# Patient Record
Sex: Male | Born: 1975
Health system: Southern US, Community
[De-identification: ages and names within clinical notes are randomized; demographics above are authoritative.]

## PROBLEM LIST (undated history)

## (undated) DIAGNOSIS — I10 Essential (primary) hypertension: Secondary | ICD-10-CM

## (undated) DIAGNOSIS — K259 Gastric ulcer, unspecified as acute or chronic, without hemorrhage or perforation: Secondary | ICD-10-CM

## (undated) DIAGNOSIS — E119 Type 2 diabetes mellitus without complications: Secondary | ICD-10-CM

## (undated) HISTORY — DX: Type 2 diabetes mellitus without complications: E11.9

## (undated) HISTORY — DX: Essential (primary) hypertension: I10

## (undated) HISTORY — PX: NO PAST SURGERIES: SHX2092

---

## 2017-11-25 ENCOUNTER — Emergency Department (HOSPITAL_COMMUNITY)
Admission: EM | Admit: 2017-11-25 | Discharge: 2017-11-25 | Disposition: A | Discharge status: Home / Self Care | Payer: Self-pay | Attending: Emergency Medicine | Admitting: Emergency Medicine

## 2017-11-25 ENCOUNTER — Encounter (HOSPITAL_COMMUNITY): Payer: Self-pay

## 2017-11-25 ENCOUNTER — Emergency Department (HOSPITAL_COMMUNITY): Payer: Self-pay

## 2017-11-25 DIAGNOSIS — R531 Weakness: Secondary | ICD-10-CM | POA: Insufficient documentation

## 2017-11-25 DIAGNOSIS — R29898 Other symptoms and signs involving the musculoskeletal system: Secondary | ICD-10-CM

## 2017-11-25 DIAGNOSIS — K279 Peptic ulcer, site unspecified, unspecified as acute or chronic, without hemorrhage or perforation: Secondary | ICD-10-CM | POA: Insufficient documentation

## 2017-11-25 DIAGNOSIS — F17210 Nicotine dependence, cigarettes, uncomplicated: Secondary | ICD-10-CM | POA: Insufficient documentation

## 2017-11-25 HISTORY — DX: Gastric ulcer, unspecified as acute or chronic, without hemorrhage or perforation: K25.9

## 2017-11-25 LAB — I-STAT TROPONIN, ED: TROPONIN I, POC: 0 ng/mL (ref 0.00–0.08)

## 2017-11-25 LAB — CBC WITH DIFFERENTIAL/PLATELET
BASOS ABS: 0 10*3/uL (ref 0.0–0.1)
BASOS PCT: 0 %
Eosinophils Absolute: 0 10*3/uL (ref 0.0–0.7)
Eosinophils Relative: 0 %
HEMATOCRIT: 47.9 % (ref 39.0–52.0)
Hemoglobin: 17.1 g/dL — ABNORMAL HIGH (ref 13.0–17.0)
LYMPHS ABS: 4.1 10*3/uL — AB (ref 0.7–4.0)
Lymphocytes Relative: 28 %
MCH: 31 pg (ref 26.0–34.0)
MCHC: 35.7 g/dL (ref 30.0–36.0)
MCV: 86.8 fL (ref 78.0–100.0)
MONO ABS: 0.9 10*3/uL (ref 0.1–1.0)
MONOS PCT: 6 %
NEUTROS PCT: 66 %
Neutro Abs: 9.5 10*3/uL — ABNORMAL HIGH (ref 1.7–7.7)
PLATELETS: 305 10*3/uL (ref 150–400)
RBC: 5.52 MIL/uL (ref 4.22–5.81)
RDW: 12.9 % (ref 11.5–15.5)
WBC: 14.7 10*3/uL — ABNORMAL HIGH (ref 4.0–10.5)

## 2017-11-25 LAB — CBG MONITORING, ED: Glucose-Capillary: 140 mg/dL — ABNORMAL HIGH (ref 65–99)

## 2017-11-25 LAB — COMPREHENSIVE METABOLIC PANEL
ALBUMIN: 4 g/dL (ref 3.5–5.0)
ALT: 23 U/L (ref 17–63)
AST: 19 U/L (ref 15–41)
Alkaline Phosphatase: 93 U/L (ref 38–126)
Anion gap: 12 (ref 5–15)
BILIRUBIN TOTAL: 0.7 mg/dL (ref 0.3–1.2)
BUN: 9 mg/dL (ref 6–20)
CALCIUM: 10 mg/dL (ref 8.9–10.3)
CO2: 23 mmol/L (ref 22–32)
Chloride: 100 mmol/L — ABNORMAL LOW (ref 101–111)
Creatinine, Ser: 0.83 mg/dL (ref 0.61–1.24)
GFR calc Af Amer: >60 mL/min (ref >60)
GLUCOSE: 146 mg/dL — AB (ref 65–99)
Potassium: 4 mmol/L (ref 3.5–5.1)
Sodium: 135 mmol/L (ref 135–145)
TOTAL PROTEIN: 7.5 g/dL (ref 6.5–8.1)

## 2017-11-25 LAB — APTT: aPTT: 34 seconds (ref 24–36)

## 2017-11-25 LAB — I-STAT CHEM 8, ED
BUN: 9 mg/dL (ref 6–20)
CALCIUM ION: 1.23 mmol/L (ref 1.15–1.40)
CREATININE: 0.7 mg/dL (ref 0.61–1.24)
Chloride: 101 mmol/L (ref 101–111)
Glucose, Bld: 146 mg/dL — ABNORMAL HIGH (ref 65–99)
HEMATOCRIT: 50 % (ref 39.0–52.0)
HEMOGLOBIN: 17 g/dL (ref 13.0–17.0)
Potassium: 4 mmol/L (ref 3.5–5.1)
Sodium: 138 mmol/L (ref 135–145)
TCO2: 25 mmol/L (ref 22–32)

## 2017-11-25 LAB — ETHANOL: Alcohol, Ethyl (B): <10 mg/dL (ref <10)

## 2017-11-25 LAB — LIPASE, BLOOD: Lipase: 31 U/L (ref 11–51)

## 2017-11-25 LAB — PROTIME-INR
INR: 1.04
Prothrombin Time: 13.5 seconds (ref 11.4–15.2)

## 2017-11-25 MED ORDER — HYDROMORPHONE HCL 1 MG/ML IJ SOLN: 1.0000 mg | Freq: Once | INTRAMUSCULAR | Status: DC

## 2017-11-25 MED ORDER — ALUM & MAG HYDROXIDE-SIMETH 200-200-20 MG/5ML PO SUSP
30.0000 mL | Freq: Once | ORAL | Status: AC
  Administered 2017-11-25: 30 mL via ORAL
  Filled 2017-11-25: qty 30

## 2017-11-25 MED ORDER — PANTOPRAZOLE SODIUM 40 MG IV SOLR
40.0000 mg | Freq: Once | INTRAVENOUS | Status: AC
Start: 2017-11-25 — End: 2017-11-25
  Administered 2017-11-25: 40 mg via INTRAVENOUS
  Filled 2017-11-25: qty 40

## 2017-11-25 MED ORDER — SODIUM CHLORIDE 0.9 % IV BOLUS (SEPSIS)
1000.0000 mL | Freq: Once | INTRAVENOUS | Status: AC
  Administered 2017-11-25: 1000 mL via INTRAVENOUS

## 2017-11-25 MED ORDER — ONDANSETRON HCL 4 MG/2ML IJ SOLN
4.0000 mg | Freq: Once | INTRAMUSCULAR | Status: AC
  Administered 2017-11-25: 4 mg via INTRAVENOUS
  Filled 2017-11-25: qty 2

## 2017-11-25 MED ORDER — HYDROMORPHONE HCL 1 MG/ML IJ SOLN
1.0000 mg | Freq: Once | INTRAMUSCULAR | Status: AC
  Administered 2017-11-25: 1 mg via INTRAVENOUS
  Filled 2017-11-25: qty 1

## 2017-11-25 MED ORDER — FAMOTIDINE IN NACL 20-0.9 MG/50ML-% IV SOLN
20.0000 mg | Freq: Once | INTRAVENOUS | Status: AC
  Administered 2017-11-25: 20 mg via INTRAVENOUS
  Filled 2017-11-25: qty 50

## 2017-11-25 MED ORDER — IOPAMIDOL (ISOVUE-300) INJECTION 61%
INTRAVENOUS | Status: AC
  Administered 2017-11-25: 100 mL
  Filled 2017-11-25: qty 100

## 2017-11-25 MED ORDER — OMEPRAZOLE 20 MG PO CPDR: 20.0000 mg | DELAYED_RELEASE_CAPSULE | Freq: Every day | ORAL | 0 refills | Status: DC

## 2017-11-25 NOTE — ED Notes (Signed)
Code stroke @ 1558

## 2017-11-25 NOTE — ED Provider Notes (Signed)
[facility] EMERGENCY DEPARTMENT Provider Note   CSN: [id number] Arrival date & time: 11/25/17  1551     History   Chief Complaint Chief Complaint  Patient presents with  . Code Stroke    HPI Frederick Cervantes is a 42 y.o. male.  The history is provided by the patient.  Neurologic Problem  This is a new problem. The current episode started 6 to 12 hours ago (730am). The problem has been resolved. Associated symptoms include abdominal pain. Pertinent negatives include no chest pain, no headaches and no shortness of breath. Nothing aggravates the symptoms.  Abdominal Pain   This is a new problem. The current episode started 6 to 12 hours ago. The problem occurs constantly. The problem has been gradually worsening. The pain is associated with eating. The pain is located in the epigastric region. The quality of the pain is sharp. The pain is severe. Associated symptoms include nausea, vomiting and constipation. Pertinent negatives include fever, dysuria, frequency and headaches.  -Patient states around 7 AM he was driving around and he had left arm weakness and cannot control the steering well but not resolved in less than an hour.  He states he does still have some tingling and numbness in his fingertips on the left hand.  He states he also started having severe epigastric abdominal pain and vomited about 10 times.  He states he has a history of stomach ulcers and this feels like that however is worse in intensity.  He states over the last 2 days he has taken about 40 tablets of Tums due to severe heartburn.  Past Medical History:  Diagnosis Date  . Ulcer, stomach peptic     There are no active problems to display for this patient.   History reviewed. No pertinent surgical history.      Home Medications    Prior to Admission medications   Medication Sig Start Date End Date Taking? Authorizing Provider  acetaminophen (TYLENOL) 500 MG tablet Take 500-1,000 mg  by mouth every 6 (six) hours as needed (for headaches).   Yes [provider]  aspirin EC 81 MG tablet Take 81 mg by mouth daily as needed ("for hypertension").    Yes [provider]  calcium carbonate (TUMS - DOSED IN MG ELEMENTAL CALCIUM) 500 MG chewable tablet Chew 1-2 tablets by mouth as needed for indigestion or heartburn.   Yes [provider]  omeprazole (PRILOSEC) 20 MG capsule Take 1 capsule (20 mg total) by mouth daily. 11/25/17   Dwana Melena, DO    Family History History reviewed. No pertinent family history.  Social History Social History   Tobacco Use  . Smoking status: Current Every Day Smoker    Packs/day: 1.00    Types: Cigarettes  . Smokeless tobacco: Never Used  Substance Use Topics  . Alcohol use: Never    Frequency: Never  . Drug use: Never     Allergies   Patient has no known allergies.   Review of Systems Review of Systems  Constitutional: Positive for appetite change and chills. Negative for fever.  HENT: Negative.   Eyes: Negative for visual disturbance.  Respiratory: Negative for cough and shortness of breath.   Cardiovascular: Negative for chest pain and leg swelling.  Gastrointestinal: Positive for abdominal pain, constipation, nausea and vomiting. Negative for abdominal distention and blood in stool.  Genitourinary: Negative for dysuria, flank pain and frequency.  Musculoskeletal: Negative for back pain.  Skin: Negative for rash.  Neurological: Positive for weakness and numbness. Negative for dizziness, facial asymmetry, speech difficulty and headaches.  All other systems reviewed and are negative.    Physical Exam Updated Vital Signs BP (!) 135/91   Pulse 76   Temp 98.7 F (37.1 C) (Oral)   Resp 19   Ht 6' (1.829 m)   Wt 112 kg (246 lb 14.6 oz)   SpO2 96%   BMI 33.49 kg/m   Physical Exam  Constitutional: He is oriented to person, place, and time. He appears well-developed and well-nourished. He appears  distressed.  HENT:  Head: Normocephalic and atraumatic.  Mouth/Throat: Oropharynx is clear and moist.  Eyes: Conjunctivae are normal.  Neck: Neck supple.  Cardiovascular: Normal rate, regular rhythm and intact distal pulses.  No murmur heard. Pulmonary/Chest: Effort normal and breath sounds normal. No respiratory distress. He has no wheezes. He has no rales.  Abdominal: Soft. He exhibits no distension. There is tenderness. There is guarding (localized guarding in epigastrium).  Musculoskeletal: He exhibits no edema.  Neurological: He is alert and oriented to person, place, and time. He has normal strength. A sensory deficit (mild L hand numbness) is present. No cranial nerve deficit. GCS eye subscore is 4. GCS verbal subscore is 5. GCS motor subscore is 6.  Skin: Skin is warm and dry.  Psychiatric: He has a normal mood and affect.  Nursing note and vitals reviewed.    ED Treatments / Results  Labs (all labs ordered are listed, but only abnormal results are displayed) Labs Reviewed  COMPREHENSIVE METABOLIC PANEL - Abnormal; Notable for the following components:      Result Value   Chloride 100 (*)    Glucose, Bld 146 (*)    All other components within normal limits  CBC WITH DIFFERENTIAL/PLATELET - Abnormal; Notable for the following components:   WBC 14.7 (*)    Hemoglobin 17.1 (*)    Neutro Abs 9.5 (*)    Lymphs Abs 4.1 (*)    All other components within normal limits  I-STAT CHEM 8, ED - Abnormal; Notable for the following components:   Glucose, Bld 146 (*)    All other components within normal limits  CBG MONITORING, ED - Abnormal; Notable for the following components:   Glucose-Capillary 140 (*)    All other components within normal limits  ETHANOL  PROTIME-INR  APTT  LIPASE, BLOOD  CBC  DIFFERENTIAL  RAPID URINE DRUG SCREEN, HOSP PERFORMED  URINALYSIS, ROUTINE W REFLEX MICROSCOPIC  I-STAT TROPONIN, ED    EKG EKG Interpretation  Date/Time:  Friday November 25 2017 16:20:24 EDT Ventricular Rate:  88 PR Interval:    QRS Duration: 82 QT Interval:  336 QTC Calculation: 407 R Axis:   72 Text Interpretation:  Sinus rhythm Probable left atrial enlargement Anteroseptal infarct, old Confirmed by Lorre Nick ([id number]) on 11/25/2017 4:23:31 PM   Radiology Dg Chest 2 View  Result Date: 11/25/2017 CLINICAL DATA:  Abdominal pain with shortness of breath today. Increased thirst. EXAM: CHEST - 2 VIEW COMPARISON:  None. FINDINGS: Lordotic positioning on the AP view. The heart size and mediastinal contours are normal. The lungs are clear. There is no pleural effusion or pneumothorax. No acute osseous findings are identified. Telemetry leads overlie the chest. IMPRESSION: No active cardiopulmonary process. Electronically Signed   By: Carey Bullocks M.D.   On: 11/25/2017 17:06   Ct Head Wo Contrast  Result Date: 11/25/2017 CLINICAL DATA:  Slurred speech. EXAM: CT HEAD WITHOUT CONTRAST TECHNIQUE: Contiguous axial  images were obtained from the base of the skull through the vertex without intravenous contrast. COMPARISON:  None. FINDINGS: Brain: There is focal hypodensity within the right inferior frontal pole. No hemorrhage, hydrocephalus, extra-axial collection or mass lesion/mass effect. Vascular: No hyperdense vessel or unexpected calcification. Skull: Normal. Negative for fracture or focal lesion. Sinuses/Orbits: No acute finding. Other: None. IMPRESSION: 1. Focal hypodensity within the right inferior frontal pole may reflect acute infarct, contusion in the setting of recent trauma, or artifact. No hemorrhage. Recommend MRI for further evaluation. Electronically Signed   By: Obie Dredge M.D.   On: 11/25/2017 16:27   Mr Brain Wo Contrast  Result Date: 11/25/2017 CLINICAL DATA:  Neuro deficits, subacute. Focal neuro deficit of greater than 6 hours. Stroke suspected. Left-sided weakness. Left arm numbness. Slurred speech beginning at 7 a.m. today which has since  resolved. EXAM: MRI HEAD WITHOUT CONTRAST TECHNIQUE: Multiplanar, multiecho pulse sequences of the brain and surrounding structures were obtained without intravenous contrast. COMPARISON:  CT head without contrast 11/25/2017 FINDINGS: Brain: Diffusion-weighted images demonstrate no acute or subacute infarction. There is no significant signal change to account for the abnormality seen on CT. This was likely artifactual. No significant white matter disease is present. The ventricles are of normal size. Brainstem and cerebellum are normal. The internal auditory canals are within normal limits. No significant extraaxial fluid collection is present. Vascular: Flow is present in the major intracranial arteries. Skull and upper cervical spine: The skull base is within normal limits. The craniocervical junction is normal. Marrow signal is normal. The upper cervical spine is unremarkable. Sinuses/Orbits: Mild mucosal thickening is present in the anterior ethmoid air cells bilaterally. The paranasal sinuses are otherwise clear. Globes and orbits are within normal limits. IMPRESSION: 1. Normal MRI of the brain.  The CT finding was artifactual. 2. Minimal ethmoid sinus disease. Electronically Signed   By: Marin Roberts M.D.   On: 11/25/2017 18:58   Ct Abdomen Pelvis W Contrast  Result Date: 11/25/2017 CLINICAL DATA:  Acute epigastric pain with nausea and vomiting. History of gastric ulcers. EXAM: CT ABDOMEN AND PELVIS WITH CONTRAST TECHNIQUE: Multidetector CT imaging of the abdomen and pelvis was performed using the standard protocol following bolus administration of intravenous contrast. CONTRAST:  ISOVUE-300 IOPAMIDOL (ISOVUE-300) INJECTION 61% COMPARISON:  None. FINDINGS: The patient vomited at the beginning of the scan. The study was resumed after a slight pause. This resulted in delayed phase imaging. No other reported complications. Lower chest: Clear lung bases. No significant pleural or pericardial  effusion. Hepatobiliary: Background mild hepatic steatosis without focal abnormality. No evidence of gallstones, gallbladder wall thickening or biliary dilatation. Pancreas: Unremarkable. No pancreatic ductal dilatation or surrounding inflammatory changes. Spleen: Normal in size without focal abnormality. Adrenals/Urinary Tract: Both adrenal glands appear normal. The kidneys appear normal without evidence of urinary tract calculus, suspicious lesion or hydronephrosis. No bladder abnormalities are seen. Stomach/Bowel: No enteric contrast administered. No gross abnormality of the stomach. No evidence of bowel wall thickening, distention or surrounding inflammatory change. The appendix appears normal. Vascular/Lymphatic: There are no enlarged abdominal or pelvic lymph nodes. Limited contrast bolus as above. Aortic and branch vessel atherosclerosis without evidence of acute vascular findings. Reproductive: The prostate gland and seminal vesicles appear unremarkable. Other: Tiny umbilical hernia containing only fat.  No ascites. Musculoskeletal: No acute or significant osseous findings. IMPRESSION: 1. No acute findings or explanation for the patient's symptoms. Gastric evaluation limited by the lack of enteric contrast. 2. Mild hepatic steatosis. 3. Mild Aortic  Atherosclerosis (ICD10-I70.0). Electronically Signed   By: Carey Bullocks M.D.   On: 11/25/2017 19:53    Procedures Procedures (including critical care time)  Medications Ordered in ED Medications  ondansetron (ZOFRAN) injection 4 mg (4 mg Intravenous Given 11/25/17 1630)  HYDROmorphone (DILAUDID) injection 1 mg (1 mg Intravenous Given 11/25/17 1631)  sodium chloride 0.9 % bolus 1,000 mL (0 mLs Intravenous Stopped 11/25/17 1735)  iopamidol (ISOVUE-300) 61 % injection (100 mLs  Contrast Given 11/25/17 1913)  alum & mag hydroxide-simeth (MAALOX/MYLANTA) 200-200-20 MG/5ML suspension 30 mL (30 mLs Oral Given 11/25/17 2018)  ondansetron (ZOFRAN) injection 4 mg  (4 mg Intravenous Given 11/25/17 2018)  sodium chloride 0.9 % bolus 1,000 mL (0 mLs Intravenous Stopped 11/25/17 2105)  HYDROmorphone (DILAUDID) injection 1 mg (1 mg Intravenous Given 11/25/17 2042)  famotidine (PEPCID) IVPB 20 mg premix (0 mg Intravenous Stopped 11/25/17 2117)  pantoprazole (PROTONIX) injection 40 mg (40 mg Intravenous Given 11/25/17 2047)     Initial Impression / Assessment and Plan / ED Course  I have reviewed the triage vital signs and the nursing notes.  Pertinent labs & imaging results that were available during my care of the patient were reviewed by me and considered in my medical decision making (see chart for details).    Patient is a 42 year old male, left arm weakness episode.With history of tobacco abuse, stomach ulcer who presents for evaluation of severe epigastric abdominal pain, vomiting  A code stroke was called from triage due to reported left arm weakness.  He is outside of any TPA window and on initial screening exam he had no notable deficits on exam so the code stroke is discontinued.  The initial CT had had some question of findings in the right frontal lobe but this was favored to be artifact by the neuroradiologist with discussion with the neurologist.  Stat MRI is ordered per neurology recommendations to evaluate for acute stroke.  This was also negative for any acute findings.  Abdominal labs obtained as above.  Patient did have a leukocytosis of 14.7, normal hemoglobin.  CMP did not show any acute findings.  Lipase was normal.  UA was normal.  He had a negative troponin and EKG.  CT abdomen pelvis obtained to rule out perforated GI ulcer.  This is negative for any acute findings.  Patient received a total of 2 doses of Dilaudid and Zofran, as well as IV Pepcid and Protonix.  Maalox given as well.  On reevaluation patient's pain was controlled he states his symptoms have resolved.  During the ED course spoke with Dr. Wilford Corner and Dr. Luan Pulling regarding  this patient.  After his MRI was done, spoke with Dr. Luan Pulling who is on tonight and given he is otherwise low risk and has a negative MRI, no further inpatient TIA workup needed.  Patient can follow-up with outpatient neurology so he will be referred to low-power neurology.  Patient's epigastric abdominal pain likely secondary to peptic ulcer disease.  Patient counseled on smoking cessation and dietary changes.  Omeprazole as prescribed.  We will hold off on low-dose aspirin for secondary stroke prevention given we are also treating him for likely peptic ulcer disease.  Patient will follow up with [facility] gastroenterology regarding this.    Patient also referred to the PCP for follow-up of likely hypertension and any other comorbidities that may be undiagnosed.  Patient discharged in good condition with strict return precautions.   Final Clinical Impressions(s) / ED Diagnoses   Final diagnoses:  PUD (  peptic ulcer disease)  Left arm weakness    ED Discharge Orders        Ordered    omeprazole (PRILOSEC) 20 MG capsule  Daily     11/25/17 2150       Dwana MelenaDong, Riyad Keena, DO 11/25/17 2328

## 2017-11-25 NOTE — ED Notes (Signed)
Pt arrived in [place] this RN met pt in CT scanner

## 2017-11-25 NOTE — ED Notes (Signed)
Patient transported to MRI 

## 2017-11-25 NOTE — ED Triage Notes (Signed)
Pt arrived via POV from home c/o left sided weakness, left arm numbness, slurred speech since 7am, n/v abdominal pain, diaphoresis.

## 2017-11-25 NOTE — ED Notes (Signed)
Sent label to main lab to run lipase blood test.

## 2017-11-25 NOTE — ED Provider Notes (Signed)
I saw and evaluated the patient, reviewed the resident's note and I agree with the findings and plan.   EKG Interpretation  Date/Time:  Friday November 25 2017 16:20:24 EDT Ventricular Rate:  88 PR Interval:    QRS Duration: 82 QT Interval:  336 QTC Calculation: 407 R Axis:   72 Text Interpretation:  Sinus rhythm Probable left atrial enlargement Anteroseptal infarct, old Confirmed by Lorre NickAllen, Keelon Zurn ([id number]) on 11/25/2017 4:23:31 PM        42 year old male presents with 1 day history of left hand paresthesias.  Symptoms sudden onset.  No associated ataxia.  No severe headaches.  On exam here he is neurological exam is positive for subjective left hand paresthesias.  His speech is normal.  Workup is pending at this time   Lorre NickAllen, Zaliyah Meikle, MD 11/25/17 1624

## 2019-04-07 IMAGING — CT CT HEAD W/O CM
4 series · 16 of 47 positions shown, 18 images · non-contrast
Comparison: None.

CLINICAL DATA: Slurred speech.

EXAM:
CT HEAD WITHOUT CONTRAST
TECHNIQUE: Contiguous axial images were obtained from the base of the skull
through the vertex without intravenous contrast.

[Series 3: head wo · axial · 0.46mm/px · z∈[-144,-24]mm · 7 of 33 slices shown, 9 images]
[im 5/33  brain]
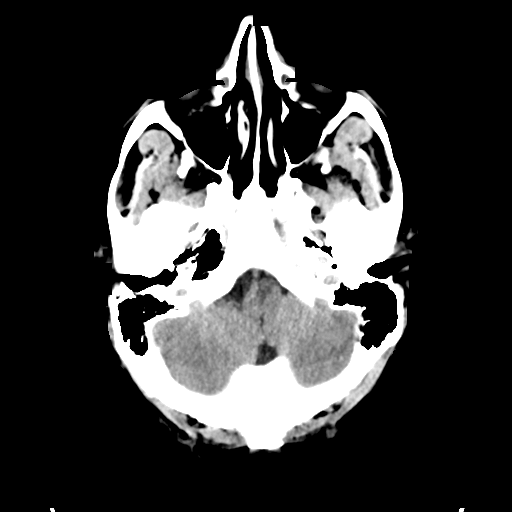
[im 5/33  bone]
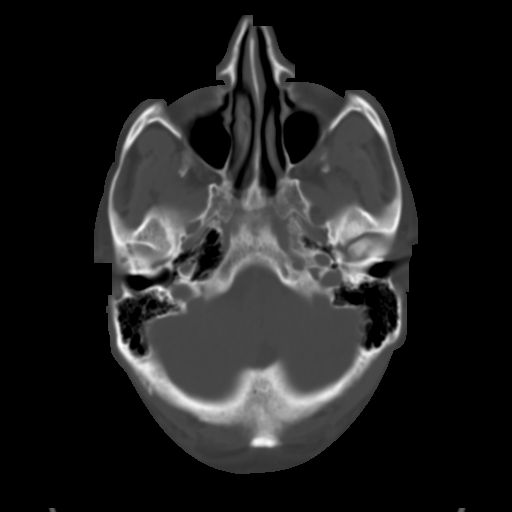
[im 9/33  brain]
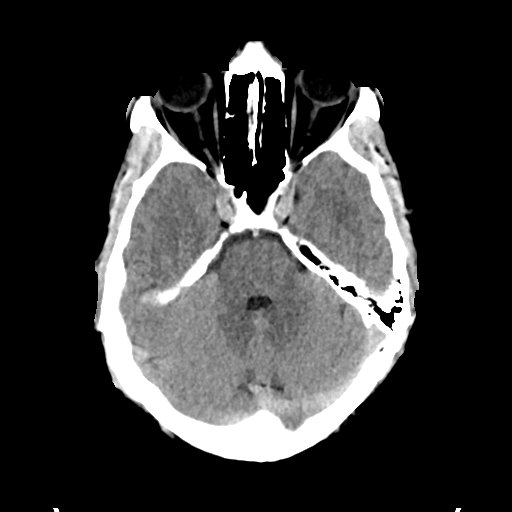
[im 13/33  brain]
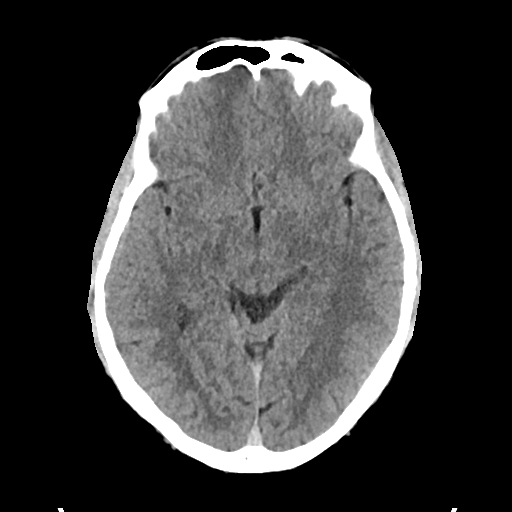
[im 17/33  brain]
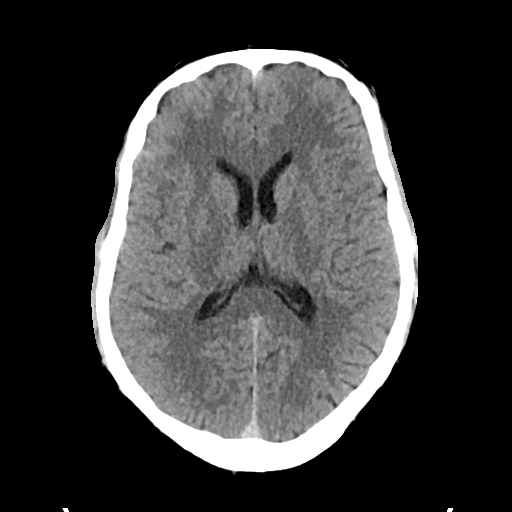
[im 21/33  brain]
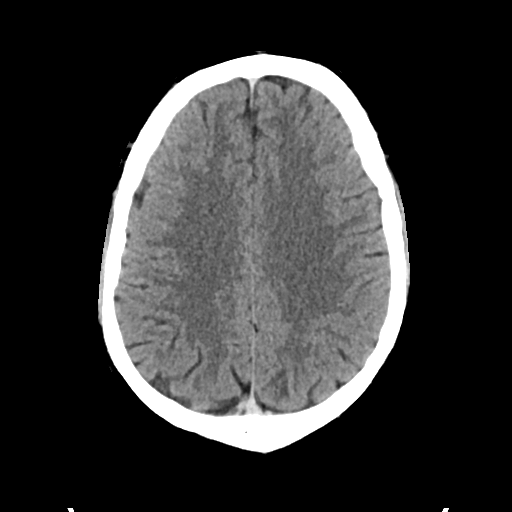
[im 21/33  bone]
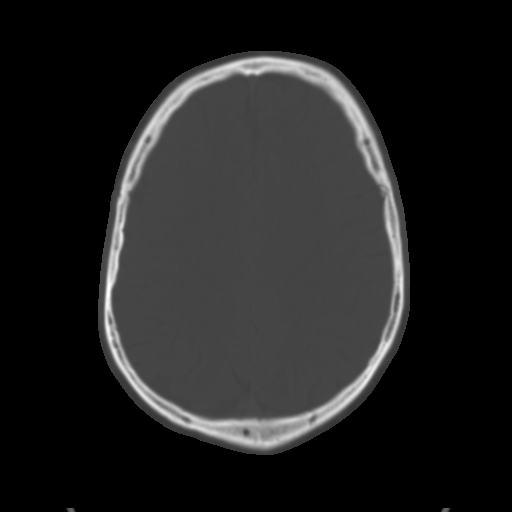
[im 25/33  brain]
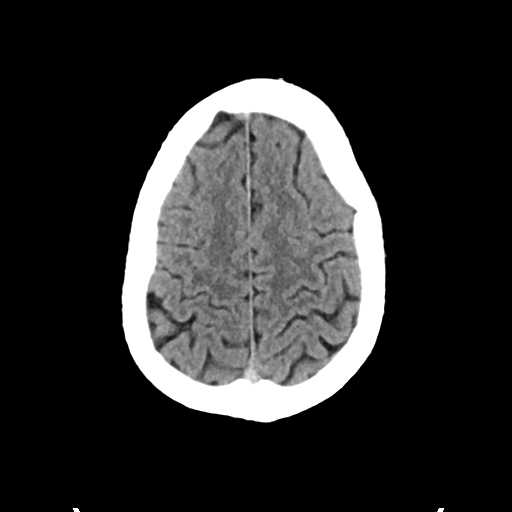
[im 29/33  brain]
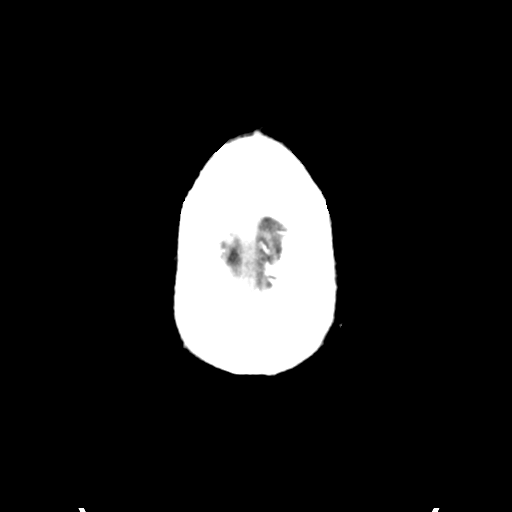

[Series 4: head bone · axial · 0.46mm/px · z∈[-148,-116]mm · 3 of 81 slices shown]
[im 9/81  bone]
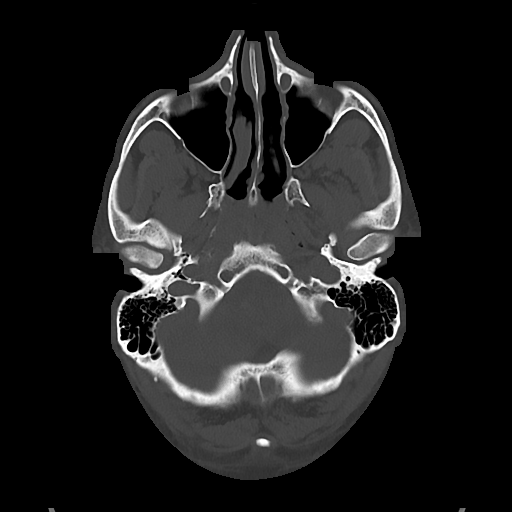
[im 17/81  bone]
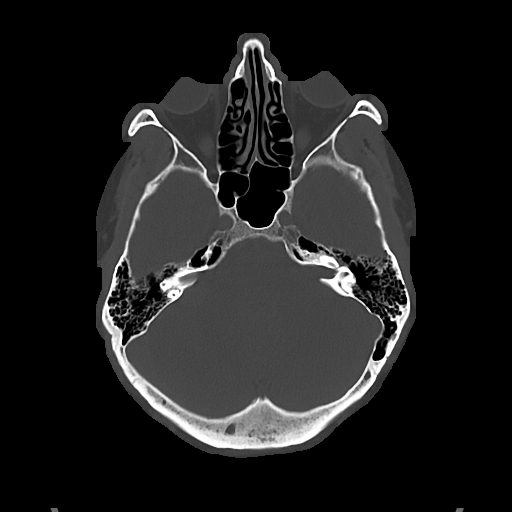
[im 25/81  bone]
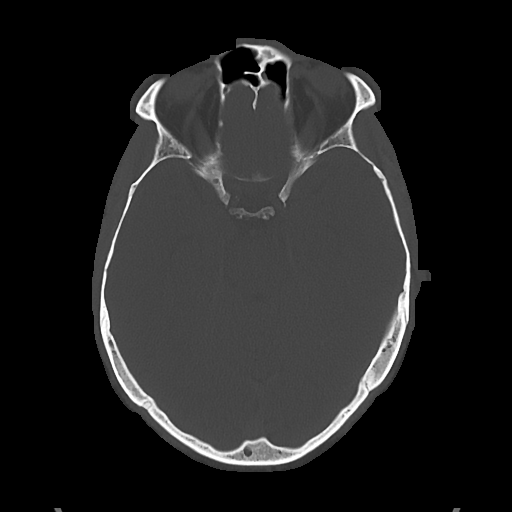

[Series 5: cor soft · coronal · 0.34mm/px · 3 of 78 slices shown]
[im 26/78  brain]
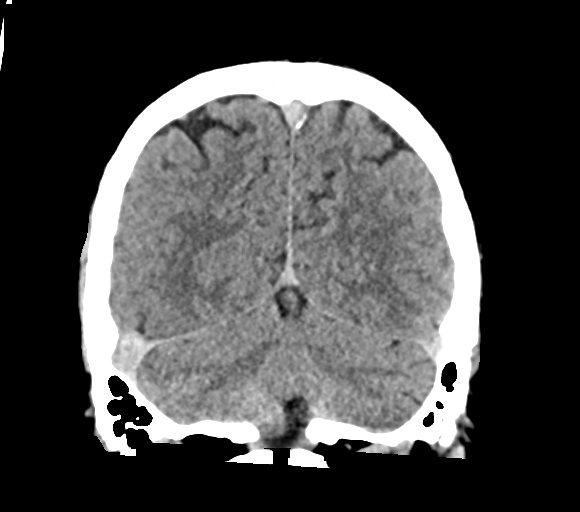
[im 35/78  brain]
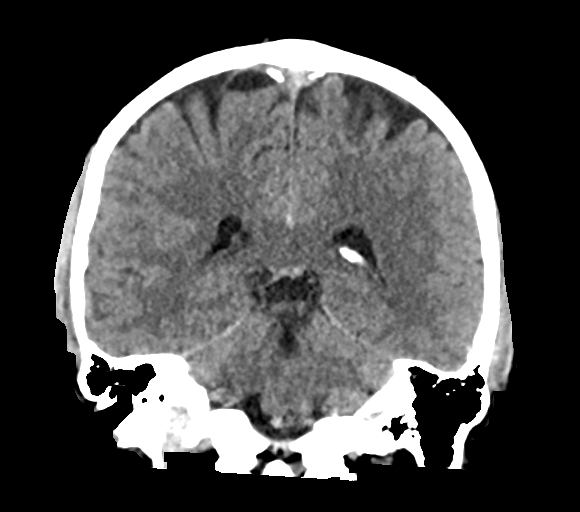
[im 43/78  brain]
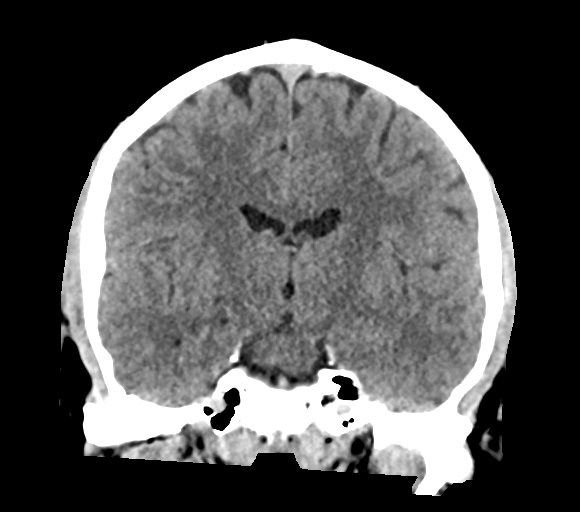

[Series 6: sag soft · sagittal · 0.32mm/px · 3 of 67 slices shown]
[im 23/67  brain]
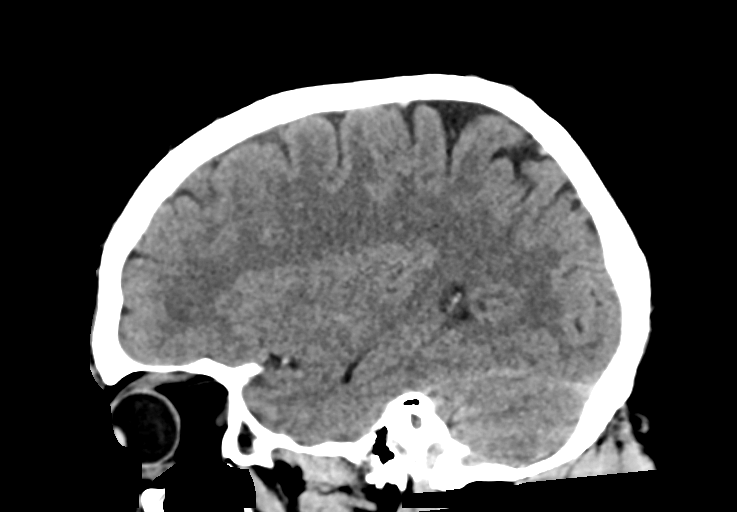
[im 34/67  brain]
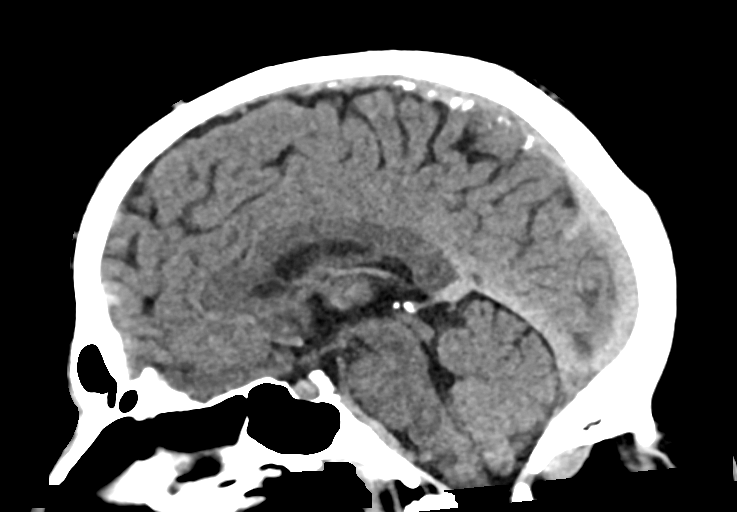
[im 45/67  brain]
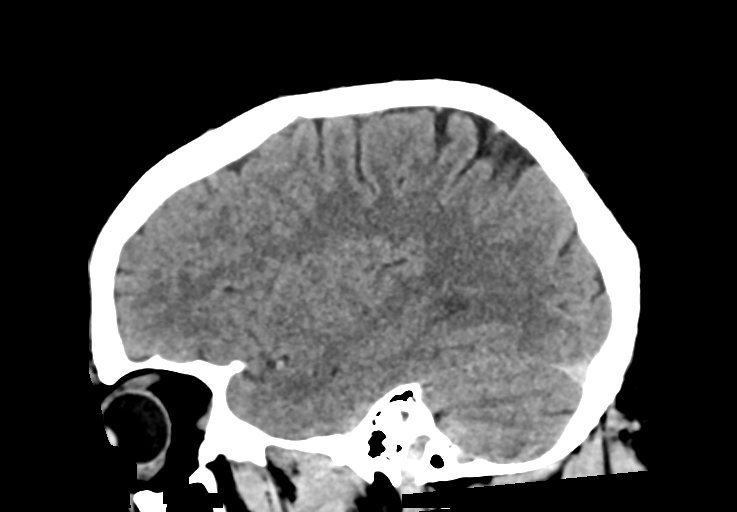

[16 of 47 positions shown; findings below may reference images not displayed]

FINDINGS: Brain: There is focal hypodensity within the right inferior frontal
pole. No hemorrhage, hydrocephalus, extra-axial collection or mass
lesion/mass effect.

Vascular: No hyperdense vessel or unexpected calcification.

Skull: Normal. Negative for fracture or focal lesion.

Sinuses/Orbits: No acute finding.

Other: None.
IMPRESSION: 1. Focal hypodensity within the right inferior frontal pole may
reflect acute infarct, contusion in the setting of recent trauma, or
artifact. No hemorrhage. Recommend MRI for further evaluation.

## 2020-03-12 ENCOUNTER — Emergency Department (HOSPITAL_COMMUNITY)
Admission: EM | Admit: 2020-03-12 | Discharge: 2020-03-12 | Disposition: A | Discharge status: Home / Self Care | Payer: Self-pay | Attending: Emergency Medicine | Admitting: Emergency Medicine

## 2020-03-12 ENCOUNTER — Emergency Department (HOSPITAL_COMMUNITY): Payer: Self-pay

## 2020-03-12 ENCOUNTER — Other Ambulatory Visit: Payer: Self-pay

## 2020-03-12 ENCOUNTER — Encounter (HOSPITAL_COMMUNITY): Payer: Self-pay | Admitting: Emergency Medicine

## 2020-03-12 DIAGNOSIS — R5383 Other fatigue: Secondary | ICD-10-CM | POA: Insufficient documentation

## 2020-03-12 DIAGNOSIS — Z7982 Long term (current) use of aspirin: Secondary | ICD-10-CM | POA: Insufficient documentation

## 2020-03-12 DIAGNOSIS — R05 Cough: Secondary | ICD-10-CM | POA: Insufficient documentation

## 2020-03-12 DIAGNOSIS — F17210 Nicotine dependence, cigarettes, uncomplicated: Secondary | ICD-10-CM | POA: Insufficient documentation

## 2020-03-12 DIAGNOSIS — R0789 Other chest pain: Secondary | ICD-10-CM | POA: Insufficient documentation

## 2020-03-12 DIAGNOSIS — R739 Hyperglycemia, unspecified: Secondary | ICD-10-CM | POA: Insufficient documentation

## 2020-03-12 DIAGNOSIS — R9431 Abnormal electrocardiogram [ECG] [EKG]: Secondary | ICD-10-CM | POA: Insufficient documentation

## 2020-03-12 DIAGNOSIS — R42 Dizziness and giddiness: Secondary | ICD-10-CM | POA: Insufficient documentation

## 2020-03-12 DIAGNOSIS — Z79899 Other long term (current) drug therapy: Secondary | ICD-10-CM | POA: Insufficient documentation

## 2020-03-12 DIAGNOSIS — R0602 Shortness of breath: Secondary | ICD-10-CM | POA: Insufficient documentation

## 2020-03-12 DIAGNOSIS — R631 Polydipsia: Secondary | ICD-10-CM | POA: Insufficient documentation

## 2020-03-12 DIAGNOSIS — R519 Headache, unspecified: Secondary | ICD-10-CM | POA: Insufficient documentation

## 2020-03-12 DIAGNOSIS — R531 Weakness: Secondary | ICD-10-CM | POA: Insufficient documentation

## 2020-03-12 DIAGNOSIS — R358 Other polyuria: Secondary | ICD-10-CM | POA: Insufficient documentation

## 2020-03-12 DIAGNOSIS — R079 Chest pain, unspecified: Secondary | ICD-10-CM

## 2020-03-12 LAB — TROPONIN I (HIGH SENSITIVITY): Troponin I (High Sensitivity): 2 ng/L (ref <18)

## 2020-03-12 LAB — URINALYSIS, ROUTINE W REFLEX MICROSCOPIC
Bacteria, UA: NONE SEEN
Bilirubin Urine: NEGATIVE
Glucose, UA: >=500 mg/dL — AB
Hgb urine dipstick: NEGATIVE
Ketones, ur: 5 mg/dL — AB
Leukocytes,Ua: NEGATIVE
Nitrite: NEGATIVE
Protein, ur: NEGATIVE mg/dL
Specific Gravity, Urine: 1.033 — ABNORMAL HIGH (ref 1.005–1.030)
pH: 6 (ref 5.0–8.0)

## 2020-03-12 LAB — CBC WITH DIFFERENTIAL/PLATELET
Abs Immature Granulocytes: 0.04 10*3/uL (ref 0.00–0.07)
Basophils Absolute: 0 10*3/uL (ref 0.0–0.1)
Basophils Relative: 0 %
Eosinophils Absolute: 0.2 10*3/uL (ref 0.0–0.5)
Eosinophils Relative: 2 %
HCT: 47.7 % (ref 39.0–52.0)
Hemoglobin: 16.6 g/dL (ref 13.0–17.0)
Immature Granulocytes: 0 %
Lymphocytes Relative: 35 %
Lymphs Abs: 3.4 10*3/uL (ref 0.7–4.0)
MCH: 29.9 pg (ref 26.0–34.0)
MCHC: 34.8 g/dL (ref 30.0–36.0)
MCV: 85.8 fL (ref 80.0–100.0)
Monocytes Absolute: 0.7 10*3/uL (ref 0.1–1.0)
Monocytes Relative: 7 %
Neutro Abs: 5.5 10*3/uL (ref 1.7–7.7)
Neutrophils Relative %: 56 %
Platelets: 254 10*3/uL (ref 150–400)
RBC: 5.56 MIL/uL (ref 4.22–5.81)
RDW: 12.1 % (ref 11.5–15.5)
WBC: 9.8 10*3/uL (ref 4.0–10.5)
nRBC: 0 % (ref 0.0–0.2)

## 2020-03-12 LAB — CBG MONITORING, ED
Glucose-Capillary: 398 mg/dL — ABNORMAL HIGH (ref 70–99)
Glucose-Capillary: 279 mg/dL — ABNORMAL HIGH (ref 70–99)
Glucose-Capillary: 259 mg/dL — ABNORMAL HIGH (ref 70–99)

## 2020-03-12 LAB — BASIC METABOLIC PANEL
Anion gap: 11 (ref 5–15)
BUN: 12 mg/dL (ref 6–20)
CO2: 24 mmol/L (ref 22–32)
Calcium: 9.2 mg/dL (ref 8.9–10.3)
Chloride: 99 mmol/L (ref 98–111)
Creatinine, Ser: 0.61 mg/dL (ref 0.61–1.24)
GFR calc Af Amer: >60 mL/min (ref >60)
GFR calc non Af Amer: >60 mL/min (ref >60)
Glucose, Bld: 366 mg/dL — ABNORMAL HIGH (ref 70–99)
Potassium: 4.1 mmol/L (ref 3.5–5.1)
Sodium: 134 mmol/L — ABNORMAL LOW (ref 135–145)

## 2020-03-12 LAB — D-DIMER, QUANTITATIVE: D-Dimer, Quant: <0.27 ug/mL-FEU (ref 0.00–0.50)

## 2020-03-12 MED ORDER — METFORMIN HCL 500 MG PO TABS: 500.0000 mg | ORAL_TABLET | Freq: Two times a day (BID) | ORAL | 0 refills | Status: DC

## 2020-03-12 MED ORDER — INSULIN ASPART 100 UNIT/ML ~~LOC~~ SOLN
5.0000 [IU] | Freq: Once | SUBCUTANEOUS | Status: AC
  Administered 2020-03-12: 19:00:00 5 [IU] via SUBCUTANEOUS
  Filled 2020-03-12: qty 0.05

## 2020-03-12 MED ORDER — SODIUM CHLORIDE 0.9 % IV BOLUS
1000.0000 mL | Freq: Once | INTRAVENOUS | Status: AC
  Administered 2020-03-12: 16:00:00 1000 mL via INTRAVENOUS

## 2020-03-12 NOTE — ED Provider Notes (Signed)
[facility]-EMERGENCY DEPT Provider Note   CSN: [id number] Arrival date & time: 03/12/20  1512     History Chief Complaint  Patient presents with  . Hyperglycemia    Frederick Cervantes is a 44 y.o. male.  Frederick Cervantes is a 44 y.o. male with a history of peptic ulcer disease and tobacco abuse, who presents to the emergency department complaining of 3 months of generalized weakness, fatigue, polyuria and polydipsia.  He reports that today his wife finally convinced him to check his blood sugar worried that that may be contributing and it was 580 both times when he checked it at home.  CBG of 450 with EMS.  Patient denies any prior diagnosis of diabetes or family history.  He states at first he thought symptoms were due to him driving a lot, not getting much rest or drinking enough water, he states that he has been incredibly thirsty despite trying to drink more water and has had frequent urination but denies dysuria.  Denies any fevers or chills.  No associated abdominal pain, nausea or vomiting.  But he does state that he has had pain over the left side of his chest that is worse with cough or deep breathing.  He states he has had a frequent dry nonproductive cough.  Does report he smokes 2 packs of cigarettes per day.  Denies history of heart or lung problems, no previous history of blood clots.  Drives several hours each day.  He has not taken any medications to treat his symptoms prior to arrival.  No other aggravating or alleviating factors.  Reports that he does not see a doctor regularly.        Past Medical History:  Diagnosis Date  . Ulcer, stomach peptic     There are no problems to display for this patient.   History reviewed. No pertinent surgical history.     No family history on file.  Social History   Tobacco Use  . Smoking status: Current Every Day Smoker    Packs/day: 1.00    Types: Cigarettes  . Smokeless tobacco: Never Used    Substance Use Topics  . Alcohol use: Never  . Drug use: Never    Home Medications Prior to Admission medications   Medication Sig Start Date End Date Taking? Authorizing Provider  aspirin EC 81 MG tablet Take 81 mg by mouth daily as needed ("for hypertension").    Yes [provider]  calcium carbonate (TUMS - DOSED IN MG ELEMENTAL CALCIUM) 500 MG chewable tablet Chew 1-2 tablets by mouth as needed for indigestion or heartburn.   Yes [provider]  omeprazole (PRILOSEC) 20 MG capsule Take 1 capsule (20 mg total) by mouth daily. 11/25/17  Yes Dong, Robin, DO  acetaminophen (TYLENOL) 500 MG tablet Take 500-1,000 mg by mouth every 6 (six) hours as needed (for headaches). Patient not taking: Reported on 03/12/2020    [provider]  metFORMIN (GLUCOPHAGE) 500 MG tablet Take 1 tablet (500 mg total) by mouth 2 (two) times daily with a meal. 03/12/20   Dartha LodgeFord, Gavyn Ybarra N, PA-C    Allergies    Patient has no known allergies.  Review of Systems   Review of Systems  Constitutional: Positive for fatigue and unexpected weight change. Negative for chills and fever.  HENT: Negative.   Eyes: Positive for visual disturbance.  Respiratory: Positive for cough and shortness of breath.   Cardiovascular: Positive for chest pain. Negative for  palpitations and leg swelling.  Gastrointestinal: Positive for constipation. Negative for abdominal pain, blood in stool, diarrhea, nausea and vomiting.  Endocrine: Positive for polydipsia and polyuria.  Genitourinary: Negative for dysuria and frequency.  Musculoskeletal: Positive for myalgias. Negative for arthralgias.  Skin: Negative for color change and rash.  Neurological: Positive for dizziness, weakness (Generalized) and headaches. Negative for tremors, seizures, syncope, facial asymmetry, speech difficulty, light-headedness and numbness.    Physical Exam Updated Vital Signs BP 127/82 (BP Location: Right Arm)   Pulse 80   Temp 97.7  F (36.5 C) (Oral)   Resp 18   SpO2 95%   Physical Exam Vitals and nursing note reviewed.  Constitutional:      General: He is not in acute distress.    Appearance: Normal appearance. He is well-developed and normal weight. He is not ill-appearing or diaphoretic.     Comments: Patient is overall well-appearing and in no distress  HENT:     Head: Normocephalic and atraumatic.  Eyes:     General:        Right eye: No discharge.        Left eye: No discharge.     Pupils: Pupils are equal, round, and reactive to light.  Cardiovascular:     Rate and Rhythm: Normal rate and regular rhythm.     Pulses: Normal pulses.     Heart sounds: Normal heart sounds. No murmur heard.  No friction rub. No gallop.   Pulmonary:     Effort: Pulmonary effort is normal. No respiratory distress.     Breath sounds: Normal breath sounds. No wheezing or rales.     Comments: Respirations equal and unlabored, patient able to speak in full sentences, lungs clear to auscultation bilaterally, there is tenderness over the left chest wall at the costochondral margin with an area of palpable bony prominence, no overlying skin changes Chest:     Chest wall: Tenderness present.  Abdominal:     General: Bowel sounds are normal. There is no distension.     Palpations: Abdomen is soft. There is no mass.     Tenderness: There is no abdominal tenderness. There is no guarding.  Musculoskeletal:        General: No deformity.     Cervical back: Neck supple.  Skin:    General: Skin is warm and dry.     Capillary Refill: Capillary refill takes less than 2 seconds.  Neurological:     Mental Status: He is alert.     Coordination: Coordination normal.     Comments: Speech is clear, able to follow commands CN III-XII intact Normal strength in upper and lower extremities bilaterally including dorsiflexion and plantar flexion, strong and equal grip strength Sensation normal to light and sharp touch Moves extremities without  ataxia, coordination intact  Psychiatric:        Mood and Affect: Mood normal.        Behavior: Behavior normal.     ED Results / Procedures / Treatments   Labs (all labs ordered are listed, but only abnormal results are displayed) Labs Reviewed  BASIC METABOLIC PANEL - Abnormal; Notable for the following components:      Result Value   Sodium 134 (*)    Glucose, Bld 366 (*)    All other components within normal limits  URINALYSIS, ROUTINE W REFLEX MICROSCOPIC - Abnormal; Notable for the following components:   Specific Gravity, Urine 1.033 (*)    Glucose, UA >=500 (*)  Ketones, ur 5 (*)    All other components within normal limits  CBG MONITORING, ED - Abnormal; Notable for the following components:   Glucose-Capillary 398 (*)    All other components within normal limits  CBG MONITORING, ED - Abnormal; Notable for the following components:   Glucose-Capillary 279 (*)    All other components within normal limits  CBG MONITORING, ED - Abnormal; Notable for the following components:   Glucose-Capillary 259 (*)    All other components within normal limits  CBC WITH DIFFERENTIAL/PLATELET  D-DIMER, QUANTITATIVE (NOT AT [facility])  TROPONIN I (HIGH SENSITIVITY)    EKG EKG Interpretation  Date/Time:  Wednesday March 12 2020 16:18:20 EDT Ventricular Rate:  74 PR Interval:    QRS Duration: 91 QT Interval:  369 QTC Calculation: 410 R Axis:   79 Text Interpretation: Sinus rhythm ST elev, probable normal early repol pattern Since last tracing Non-specific ST-t changes are new Otherwise no significant change Confirmed by Mancel Bale [phone]) on 03/12/2020 7:33:55 PM   Radiology DG Chest Portable 1 View  Result Date: 03/12/2020 CLINICAL DATA:  Chest pain EXAM: PORTABLE CHEST 1 VIEW COMPARISON:  11/25/2017 FINDINGS: 1658 hours. The lungs are clear without focal pneumonia, edema, pneumothorax or pleural effusion. Cardiopericardial silhouette is at upper limits of normal for size. The  visualized bony structures of the thorax are intact. Telemetry leads overlie the chest. IMPRESSION: No active disease. Electronically Signed   By: Kennith Center M.D.   On: 03/12/2020 17:41    Procedures Procedures (including critical care time)  Medications Ordered in ED Medications  sodium chloride 0.9 % bolus 1,000 mL (0 mLs Intravenous Stopped 03/12/20 1813)  insulin aspart (novoLOG) injection 5 Units (5 Units Subcutaneous Given 03/12/20 1850)    ED Course  I have reviewed the triage vital signs and the nursing notes.  Pertinent labs & imaging results that were available during my care of the patient were reviewed by me and considered in my medical decision making (see chart for details).    MDM Rules/Calculators/A&P                          Patient presents with 3 months of fatigue, polyuria, polydipsia, today was found to be hyperglycemic with no prior known history.  Suspect new onset diabetes.  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  He does not have any lower extremity pain or swelling.  On arrival CBG of 398.  Will check basic labs as well as troponin, D-dimer, urinalysis, chest x-ray and EKG.  Will give IV fluids and reevaluate blood sugar.  I have independently ordered, reviewed and interpreted all labs and imaging: CBC: No leukocytosis, normal hemoglobin BMP: Glucose 366, sodium 134, no other electrolyte derangements, normal renal function, no anion gap. Troponin: Negative, given the pain has been present for 3 months would expect this to be elevated at this point if it were related to ischemia, do not think that delta troponin is indicated EKG: Some nonspecific ST changes, sinus rhythm Chest x-ray: No active cardiopulmonary disease UA: Greater than 500 glucose, but no signs of  infection  After a liter of fluids and 5 units of subcu insulin patient's blood sugar has improved to 259.  He is feeling much better after fluids.  Will start patient on Metformin 500 twice daily and have him increase to 1000 twice daily after 1 week.  I have consulted case management and they've got him set up for a PCP appointment on 7/19.  I stressed to the patient the importance of going to this appointment for continued management of new onset diabetes.  Patient's presentation is not concerning for DKA or HHS at this time and feel patient is stable for discharge home.  Return precautions discussed.  He expresses understanding and agreement.  Discharged home in good condition.  Final Clinical Impression(s) / ED Diagnoses Final diagnoses:  Hyperglycemia  Left-sided chest pain  Abnormal EKG    Rx / DC Orders ED Discharge Orders         Ordered    metFORMIN (GLUCOPHAGE) 500 MG tablet  2 times daily with meals     Discontinue  Reprint     03/12/20 1909           Dartha Lodge, PA-C 03/12/20 2020    Mancel Bale, MD 03/13/20 1027

## 2020-03-12 NOTE — Progress Notes (Signed)
TOC CM scheduled appt for [facility] for 03/24/2020 at 2 pm. Provided pt with [facility] letter to assist with medications. Pt will be able to get medications for $3 copay. This is offered only once per year. Pt currently works and lives at home with wife and small children. Provided pt with information on [facility] to sign up for medication/financial assistance. Isidoro Donning RN CCM, [facility] ED TOC CM [phone]

## 2020-03-12 NOTE — ED Triage Notes (Signed)
Pt BIB EMS from home c/o polyuria, polydipsia, and increased weakness x 3 months. Pt checked CBG with home meter and it was 580. No HX of diabetes. CBG was 450 with EMS. Also c/o chest wall pain with breathing. A&O x 4.

## 2020-03-12 NOTE — Discharge Instructions (Addendum)
Please begin taking Metformin 500 mg twice a day with food to help control your blood sugar.  After 1 week you can increase this to 1000 mg (2 tablets) twice daily with food.  It is important that you try and watch what you are eating limit any sugary drinks or desserts that can cause spikes in your blood sugar.  The rest of your work-up today has been reassuring.  I suspect chest pain that you have been having is related to coughing, you can use over-the-counter cough medications, stopping smoking will likely help with your coughing and pain.  You did have some nonspecific abnormalities on your EKG today, please discuss this with your primary care doctor.  You have been scheduled for an appointment on 7/19 with the [facility].  It is very important that you go to this appointment for continued management of diabetes that is causing your blood sugar to be elevated.  If you develop any new or worsening symptoms before your PCP appointment you can return to the ED for reevaluation.

## 2020-03-24 ENCOUNTER — Other Ambulatory Visit: Payer: Self-pay

## 2020-03-24 ENCOUNTER — Other Ambulatory Visit: Payer: Self-pay | Admitting: Pharmacist

## 2020-03-24 ENCOUNTER — Ambulatory Visit (INDEPENDENT_AMBULATORY_CARE_PROVIDER_SITE_OTHER): Payer: Self-pay | Admitting: Primary Care

## 2020-03-24 ENCOUNTER — Encounter (INDEPENDENT_AMBULATORY_CARE_PROVIDER_SITE_OTHER): Payer: Self-pay | Admitting: Primary Care

## 2020-03-24 VITALS — BP 129/71 | HR 78 | Temp 98.1°F | Ht 69.0 in | Wt 194.0 lb

## 2020-03-24 DIAGNOSIS — Z7689 Persons encountering health services in other specified circumstances: Secondary | ICD-10-CM

## 2020-03-24 DIAGNOSIS — Z720 Tobacco use: Secondary | ICD-10-CM

## 2020-03-24 DIAGNOSIS — Z09 Encounter for follow-up examination after completed treatment for conditions other than malignant neoplasm: Secondary | ICD-10-CM

## 2020-03-24 DIAGNOSIS — E119 Type 2 diabetes mellitus without complications: Secondary | ICD-10-CM

## 2020-03-24 LAB — POCT GLYCOSYLATED HEMOGLOBIN (HGB A1C): Hemoglobin A1C: 14.6 % — AB (ref 4.0–5.6)

## 2020-03-24 LAB — GLUCOSE, POCT (MANUAL RESULT ENTRY): POC Glucose: 213 mg/dl — AB (ref 70–99)

## 2020-03-24 MED ORDER — ACCU-CHEK SOFTCLIX LANCETS MISC: 12 refills | Status: DC

## 2020-03-24 MED ORDER — LISINOPRIL 2.5 MG PO TABS: 5.0000 mg | ORAL_TABLET | Freq: Every day | ORAL | 1 refills | Status: DC

## 2020-03-24 MED ORDER — TRUEPLUS LANCETS 28G MISC: 2 refills | Status: DC

## 2020-03-24 MED ORDER — ACCU-CHEK AVIVA DEVI: 0 refills | Status: DC

## 2020-03-24 MED ORDER — METFORMIN HCL 1000 MG PO TABS: 1000.0000 mg | ORAL_TABLET | Freq: Two times a day (BID) | ORAL | 3 refills | Status: DC

## 2020-03-24 MED ORDER — GLUCOSE BLOOD VI STRP: ORAL_STRIP | 12 refills | Status: DC

## 2020-03-24 MED ORDER — LANTUS SOLOSTAR 100 UNIT/ML ~~LOC~~ SOPN: 10.0000 [IU] | PEN_INJECTOR | Freq: Every day | SUBCUTANEOUS | 99 refills | Status: DC

## 2020-03-24 MED ORDER — GLIPIZIDE 10 MG PO TABS: 10.0000 mg | ORAL_TABLET | Freq: Two times a day (BID) | ORAL | 3 refills | Status: DC

## 2020-03-24 MED FILL — TRUE METRIX TEST STRIP: 33 days supply | Qty: 100 | Fill #0

## 2020-03-24 MED FILL — glipiZIDE 10 MG TABS: 10 | 30 days supply | Qty: 60 | Fill #0

## 2020-03-24 MED FILL — metFORMIN HCL 1000 MG TABS: 1000 | 30 days supply | Qty: 60 | Fill #0

## 2020-03-24 MED FILL — !TRUE METRIX BLOOD GLUCOSE: 1 days supply | Qty: 1 | Fill #0

## 2020-03-24 MED FILL — LISINOPRIL 2.5 MG TABLET: 2.5 | 30 days supply | Qty: 60 | Fill #0

## 2020-03-24 MED FILL — !LANTUS SOLOSTAR 100UNITS/M: 100 | 30 days supply | Qty: 3 | Fill #0

## 2020-03-24 MED FILL — TRUEplus LANCETS 28G MISC: 33 days supply | Qty: 100 | Fill #0

## 2020-03-24 NOTE — Patient Instructions (Signed)

## 2020-03-24 NOTE — Progress Notes (Signed)
[facility]  HPI [name] 44 y.o.male presents for follow up from the hospital and establishment of care. Marland KitchenHe presented to the emergency room on  03/12/20,  CBG of 450 with EMS new diagnosis Type 2 diabetes. Schedule appointment for diabetes follow up. He endorses  polydipsia, polyphagia  and polyuria.  Past Medical History:  Diagnosis Date  . Ulcer, stomach peptic      No Known Allergies    Current Outpatient Medications on File Prior to Visit  Medication Sig Dispense Refill  . acetaminophen (TYLENOL) 500 MG tablet Take 500-1,000 mg by mouth every 6 (six) hours as needed (for headaches). (Patient not taking: Reported on 03/12/2020)    . aspirin EC 81 MG tablet Take 81 mg by mouth daily as needed ("for hypertension").     . calcium carbonate (TUMS - DOSED IN MG ELEMENTAL CALCIUM) 500 MG chewable tablet Chew 1-2 tablets by mouth as needed for indigestion or heartburn.    . metFORMIN (GLUCOPHAGE) 500 MG tablet Take 1 tablet (500 mg total) by mouth 2 (two) times daily with a meal. 60 tablet 0  . omeprazole (PRILOSEC) 20 MG capsule Take 1 capsule (20 mg total) by mouth daily. 30 capsule 0   No current facility-administered medications on file prior to visit.    ROS: all negative except above.   Physical Exam: There were no vitals filed for this visit. There were no vitals taken for this visit. General Appearance: Well nourished, in no apparent distress. Eyes: PERRLA, EOMs, conjunctiva no swelling or erythema Sinuses: No Frontal/maxillary tenderness ENT/Mouth: Ext aud canals clear, TMs without erythema, bulging. No erythema, swelling, or exudate on post pharynx.  Tonsils not swollen or erythematous. Hearing normal.  Neck: Supple, thyroid normal.  Respiratory: Respiratory effort normal, BS equal bilaterally without rales, rhonchi, wheezing or stridor.  Cardio: RRR with no MRGs. Brisk peripheral pulses without edema.  Abdomen: Soft, + BS.  Non tender, no guarding,  rebound, hernias, masses. Lymphatics: Non tender without lymphadenopathy.  Musculoskeletal: Full ROM, 5/5 strength, normal gait.  Skin: Warm, dry without rashes, lesions, ecchymosis.  Neuro: Cranial nerves intact. Normal muscle tone, no cerebellar symptoms. Sensation intact.  Psych: Awake and oriented X 3, normal affect, Insight and Judgment appropriate.   Assessment and plan Encounter to establish care Frederick Passe, NP-C will be your  (PCP) she is mastered prepared . She is skilled to diagnosed and treat illness. Also able to answer health concern as well as continuing care of varied medical conditions, not limited by cause, organ system, or diagnosis.    Type 2 diabetes mellitus without complication, unspecified whether long term insulin use (HCC) New diagnosis presented to the emergency room on March 12, 2020 chief complaint was ongoing for 3 months generalized weakness, fatigue, polyuria and polydipsia.  CABG 450 with EMS, urine glucose greater than 500.  Discussed pathophysiology of type 2 diabetes uncontrolled complications consulted with clinical pharmacist for further diabetic teaching and insulin administration -     Glucose (CBG) -     HgB A1c -     Microalbumin, urine -     insulin glargine (LANTUS SOLOSTAR) 100 UNIT/ML Solostar Pen; Inject 10 Units into the skin daily. -     metFORMIN (GLUCOPHAGE) 1000 MG tablet; Take 1 tablet (1,000 mg total) by mouth 2 (two) times daily with a meal. -     glipiZIDE (GLUCOTROL) 10 MG tablet; Take 1 tablet (10 mg total) by mouth 2 (two) times daily before a  meal. -     Blood Glucose Monitoring Suppl (ACCU-CHEK AVIVA) device; Use as instructed -     Accu-Chek Softclix Lancets lancets; Use as instructed -     glucose blood test strip; Check 3x a day.  Hospital discharge follow-up He presented to the emergency room on  03/12/20,  CBG of 450 with EMS new diagnosis Type 2 diabetes. Schedule appointment for diabetes follow up. He endorses  polydipsia,  polyphagia  and polyuria. Appointment scheduled from ED to follow-up with PCP to establish care and the management of type 2 diabetes in 1 week.  Discharged on Metformin 500 mg twice daily and increase to 1000 mg twice daily the following week  Tobacco abuse Admits to 1 to 2 packs of cigarettes daily his occupation is delivery and driving discussed complications with tobacco abuse to include lung cancer and respiratory problems.  At each visit we will discuss cessation    Grayce Sessions

## 2020-03-25 LAB — MICROALBUMIN, URINE: Microalbumin, Urine: 105.9 ug/mL

## 2020-03-26 ENCOUNTER — Ambulatory Visit: Payer: Self-pay | Attending: Primary Care | Admitting: Pharmacist

## 2020-03-26 ENCOUNTER — Other Ambulatory Visit: Payer: Self-pay

## 2020-03-26 DIAGNOSIS — E119 Type 2 diabetes mellitus without complications: Secondary | ICD-10-CM

## 2020-03-26 LAB — GLUCOSE, POCT (MANUAL RESULT ENTRY): POC Glucose: 156 mg/dl — AB (ref 70–99)

## 2020-03-26 NOTE — Progress Notes (Signed)
S:     No chief complaint on file.  Patient arrives in good spirits. Presents for Lantus administration teaching Patient was referred and last seen by Primary Care Provider, Frederick Passe, NP on 03/24/20.   Patient is newly diagnosed in July 2021.  [name] was newly diagnosed with type-2 diabetes in July 2021. He was admitted to the ED on 7/7 due to hyperglycemia. Blood glucose readings were 398 and 450 and patient reported polydipsia, polyuria, fatigue and visual changes. He was discharged with Metformin 500 mg BID. Patient followed up with Frederick Passe, NP on 7/19. POCT glucose was 213 and A1C above goal at 14.6. Patient was started on Lantus 10 units daily and Glipizide 10 mg BID and metformin was increased to 1000 mg BID.   Today, patient reports that he has not picked up his Lantus, Glipizde, and Lisinopril from the pharmacy. Patient states he has not started Lantus or Glipizide because he believes he can manage his diabetes with his diet and exercise and taking metformin. He is also scared that his sugars will drop since they have been on the lower end. He reports fasting glucose this morning was 116, 115 yesterday morning before breakfast, and 144 last night after eating dinner. Additionally, in the past 4 days, he reports that his highest glucose reading was 170 and has not seen any readings below 70. For his diet, he has been fasting and only eats 4 hours out of the day. He also walks 2 hours everyday around the park early in the morning. Regarding his other medications, he reports not taking lisinopril 2.5 mg, aspirin 81 mg, TUMS, and omeprazole.   Family/Social History: Current every day smoker - has smoked tobacco for about 22 years, however has now decreased from 2 ppd to 1 ppd. Denies alcohol and illicit drug use  Insurance coverage/medication affordability: no insurance   Medication adherence reported: not taking Lantus or Glipizide.   Current diabetes medications  include: Lantus 10 units daily, Glipizide 10 mg BID, Metformin 1000 mg BID  Patient denies hypoglycemic events.  Patient reported dietary habits: eats vegetables, fish, chicken, drinks zero diet soda; avoids sugars, pasta, fried and fast food. Patient-reported exercise habits: walks around park for 2 hours everyday    Patient denies nocturia (nighttime urination).  Patient denies neuropathy (nerve pain). Patient denies visual changes.  O:   Lab Results  Component Value Date   HGBA1C 14.6 (A) 03/24/2020   There were no vitals filed for this visit.  Lipid Panel  No results found for: CHOL, TRIG, HDL, CHOLHDL, VLDL, LDLCALC, LDLDIRECT  Home fasting blood sugars: 116, 115 2 hour post-meal/random blood sugars: 144, 106  Clinical Atherosclerotic Cardiovascular Disease (ASCVD): No  The ASCVD Risk score Denman George DC Jr., et al., 2013) failed to calculate for the following reasons:   Cannot find a previous HDL lab   Cannot find a previous total cholesterol lab   A/P: Diabetes newly diagnosed in July 2021 currently with an A1C above goal at 14.6% on Metformin 1000 mg BID. Patient has not started Lantus or Glipizide. POCT glucose reading today was elevated at 156 mg/dL (fasting). However, patient reported home glucose readings have been on the lower end - 110s fasting and 100-140s after meals. Patient is able to verbalize appropriate hypoglycemia management plan.  -Discontinued Lantus -Discontinued Glipizide -Continued Metformin 1000 mg BID -Extensively discussed pathophysiology of diabetes, recommended lifestyle interventions, dietary effects on blood sugar control -Counseled on s/sx of and management of hypoglycemia -  Instructed patient to bring glucometer to next follow-up visit.  -Explained the renal benefits of Lisinopril 2.5 mg daily and instructed patient to pick up Lisinopril from pharmacy.  -Next A1C anticipated October 2021  ASCVD risk - primary prevention in patient with diabetes.  Last LDL is unknown and is not on statin therapy.  -Will get lipid panel today   Written patient instructions provided.  Total time in face to face counseling 30 minutes.   Follow up Pharmacist Clinic Visit in 1 month.   Fabio Neighbors, PharmD PGY2 Ambulatory Care Resident [facility]  Pharmacy

## 2020-03-27 LAB — LIPID PANEL
Chol/HDL Ratio: 5.2 ratio — ABNORMAL HIGH (ref 0.0–5.0)
Cholesterol, Total: 193 mg/dL (ref 100–199)
HDL: 37 mg/dL — ABNORMAL LOW (ref >39)
LDL Chol Calc (NIH): 130 mg/dL — ABNORMAL HIGH (ref 0–99)
Triglycerides: 143 mg/dL (ref 0–149)
VLDL Cholesterol Cal: 26 mg/dL (ref 5–40)

## 2020-03-29 ENCOUNTER — Other Ambulatory Visit (INDEPENDENT_AMBULATORY_CARE_PROVIDER_SITE_OTHER): Payer: Self-pay | Admitting: Primary Care

## 2020-03-29 DIAGNOSIS — E7800 Pure hypercholesterolemia, unspecified: Secondary | ICD-10-CM

## 2020-03-29 MED ORDER — PRAVASTATIN SODIUM 20 MG PO TABS: 20.0000 mg | ORAL_TABLET | Freq: Every day | ORAL | 3 refills | Status: DC

## 2020-03-31 MED FILL — PRAVASTATIN SODIUM 20 MG TA: 20 | 30 days supply | Qty: 30 | Fill #0

## 2020-04-29 NOTE — Progress Notes (Signed)
S:     PCP: [name], NP  Patient arrives in good spirits. Presents for 1 month diabetes follow-up visit. Patient was referred and last seen by Primary Care Provider, [name], NP on 03/24/20.  PMH: T2DM  Patient newly diagnosed in July 2021 .   At last visit with clinical pharmacist on 03/26/20, patient reported not starting Lantus or because he believed he could manage his diabetes with his lifestyle and metformin. Home sugars were in range (110s fasting and 100-140s after meals - self reported), however, POCT fasting BG was elevated at 156. Patient was continued on Metformin 1000 mg BID and asked to bring glucometer to next visit.    Today, patient reports stopping metformin mid July 2021 due to a hypoglycemic event - sugar dropped to 67. He experienced another hypoglycemic event where BG dropped from 177 to 42 after taking glipizide. Based on glucometer readings and patient's written log from 8/5 to 8/24, fasting sugars in the morning range from 107-125 (one high of 140); sugars 2 hours after eating range from 109-139; sugars in the evenings range from 93-118 (see below). Patient reports that he had unsweetened dark coffee this morning and an [place] dessert around 3AM. He reveals that his diet consists of fasting (20 hours) and eating a low carb or ketogenic diet. Additionally, patient reports medication compliance with lisinopril, however, has not started pravastatin.  Family/Social History:  -Tobacco: Current every day smoker - has smoked tobacco for about 22 years, however has now decreased from 2 ppd to 1 ppd. Denies alcohol and illicit drug use  Insurance coverage/medication affordability: no insurance   Medication adherence reported: denies Current diabetes medications include: Metformin 1000 mg BID (not taking) Current renal protective medications include: Lisinopril 2.5 mg daily Current hyperlipidemia medications include: Pravastatin 20 mg daily (not  taking)  Patient reports hypoglycemic events (67 & 42 in July 2021)  Patient reported dietary habits: Now fasting for 20 hours and eats a low carb or ketogenic diet. Eats vegetables, fish, chicken, drinks zero diet soda; avoids sugars, pasta, fried and fast food. Patient-reported exercise habits: walks around park for 2 hours everyday    Patient denies nocturia (nighttime urination).  Patient denies neuropathy (nerve pain). Patient denies visual changes.    O:  POCT: 145 mg/dL (post prandial)  8/5 to 8/24  Fasting morning sugars: 107, 125, 140, 120, 126, 113, 132, 118, 109, 116, 119, 119, 114, 118, 119, 122  2 hours after eating: 109, 136, 134, 122, 119, 133, 122, 115, 126, 126, 139, 126, 121, 122, 114, 126  Night sugars: 118, 109, 136, 120, 102, 111, 112, 114, 93, 111, 118, 101, 97, 103, 95, 99  Recorded lows: o 03/24/20: 67 o 04/02/20: 177 to 42  Lab Results  Component Value Date   HGBA1C 14.6 (A) 03/24/2020   There were no vitals filed for this visit.  Lipid Panel     Component Value Date/Time   CHOL 193 03/26/2020 1014   TRIG 143 03/26/2020 1014   HDL 37 (L) 03/26/2020 1014   CHOLHDL 5.2 (H) 03/26/2020 1014   LDLCALC 130 (H) 03/26/2020 1014    Clinical Atherosclerotic Cardiovascular Disease (ASCVD): No  The 10-year ASCVD risk score Denman George DC Jr., et al., 2013) is: 10.8%   Values used to calculate the score:     Age: 29 years     Sex: Male     Is Non-Hispanic African American: No     Diabetic: Yes  Tobacco smoker: Yes     Systolic Blood Pressure: 129 mmHg     Is BP treated: No     HDL Cholesterol: 37 mg/dL     Total Cholesterol: 193 mg/dL    A/P: Diabetes newly diagnosed currently uncontrolled with an A1C of 14.6%. Although A1C is elevated, recorded home sugars have consistently been within target goal range except for when he experienced two hypoglycemic events. Additionally, patient has improved his diet with low carb and ketogenic foods and is fasting  for 20 hours. Explained to patient that eating meals will help prevent hypoglycemia. Discussed changing metformin to the extended release formulation and decreasing the dose to prevent hypoglycemia. Patient agreed. Will plan to start metformin XR 500 mg daily and monitor home sugars and hypoglycemic episodes closely. Congratulated patient on his improved sugars.  -Discontinued Metformin 1000 mg BID -Started Metformin XR 500 mg daily -Extensively discussed pathophysiology of diabetes, recommended lifestyle interventions, dietary effects on blood sugar control -Counseled on s/sx of and management of hypoglycemia -Next A1C anticipated October 2021.   ASCVD risk - primary prevention in patient with diabetes. Last LDL of 130 mg/dL is not controlled. ASCVD risk score is not >20%  - moderate or high intensity statin indicated. -Discontinued pravastatin -Started Rosuvastatin 20 mg daily  Written patient instructions provided. Total time in face to face counseling 30 minutes.   Follow up Pharmacist Clinic Visit in 1 month.     Fabio Neighbors, PharmD PGY2 Ambulatory Care Resident [facility]   Pharmacy

## 2020-04-30 ENCOUNTER — Ambulatory Visit: Payer: Self-pay | Attending: Primary Care | Admitting: Pharmacist

## 2020-04-30 ENCOUNTER — Other Ambulatory Visit: Payer: Self-pay

## 2020-04-30 ENCOUNTER — Encounter: Payer: Self-pay | Admitting: Pharmacist

## 2020-04-30 DIAGNOSIS — IMO0002 Reserved for concepts with insufficient information to code with codable children: Secondary | ICD-10-CM

## 2020-04-30 DIAGNOSIS — E1165 Type 2 diabetes mellitus with hyperglycemia: Secondary | ICD-10-CM

## 2020-04-30 DIAGNOSIS — E119 Type 2 diabetes mellitus without complications: Secondary | ICD-10-CM

## 2020-04-30 DIAGNOSIS — E118 Type 2 diabetes mellitus with unspecified complications: Secondary | ICD-10-CM

## 2020-04-30 LAB — GLUCOSE, POCT (MANUAL RESULT ENTRY): POC Glucose: 146 mg/dl — AB (ref 70–99)

## 2020-04-30 MED ORDER — METFORMIN HCL ER 500 MG PO TB24: 500.0000 mg | ORAL_TABLET | Freq: Every day | ORAL | 2 refills | Status: DC

## 2020-04-30 MED ORDER — ROSUVASTATIN CALCIUM 20 MG PO TABS: 20.0000 mg | ORAL_TABLET | Freq: Every day | ORAL | 2 refills | Status: DC

## 2020-04-30 MED FILL — ROSUVASTATIN CALCIUM 20 MG: 20 | 30 days supply | Qty: 30 | Fill #0

## 2020-04-30 MED FILL — metFORMIN HCL ER 500 MG TB2: 500 | 30 days supply | Qty: 30 | Fill #0

## 2020-05-26 NOTE — Progress Notes (Signed)
S:     PCP: [name], NP  PMH: T2DM Patient diagnosed with diabetes in July 2021.  Patient arrives in good spirits. Presents for 29-month diabetes follow-up visit. Patient was referred and last seen by Primary Care Provider, [name], NPon 03/24/20 and last seen in pharmacy clinic on 04/30/20. At that visit, metformin IR 1000 mg BID was switched and decreased to metformin XR 500 mg daily to help prevent hypoglycemia.   Today, patient reports medication adherence and no side effects with metformin XR 500 daily. Reports home sugars range from 100-130 in the mornings and 100-150s in the afternoon/evenings (see below). Reports unsteadiness when walking, weakness, and laziness after eating breakfast. Reports this started 3 months ago and worsened last month - now has been avoiding eating breakfast. BP and sugars were normal at the time of symptoms. Usually has cheese or yogurt for breakfast. Reports lunch around 3PM and eats foods with no carbs and low sugar content, and has a small snack like almonds for dinner. Lunch is the biggest meal among breakfast and dinner.   Family/Social History: -Tobacco: Current every day smoker - has smoked tobacco for about 22 years, however has now decreased from 2 ppd to 1 ppd. Denies alcohol and illicit drug use  Insurance coverage/medication affordability:no insurance  Medication adherence reported. Current diabetes medications include: Metformin XR 500 mg daily Current renal protective medications include: Lisinopril 2.5 mg daily Current hyperlipidemia medications include: Rosuvastatin 20 mg daily  Patient denies hypoglycemic events.  Patient reported dietary habits:Now fasting for 20 hours and eats a low carb or ketogenic diet. Eats vegetables, fish, chicken, drinks zero diet soda; avoids sugars, pasta, fried and fast food. Patient-reported exercise habits:walks around park for 2 hours everyday  O:  POCT: 140  (fasting)  Glucometer readings from 9/1 - 9/21  AM: 99, 122, 130, 125, 117, 124, 114, 152, 129, 115  PM: 96, 130, 102, 155, 171, 110, 183, 120, 131, 112  Lab Results  Component Value Date   HGBA1C 14.6 (A) 03/24/2020   There were no vitals filed for this visit.  Lipid Panel     Component Value Date/Time   CHOL 193 03/26/2020 1014   TRIG 143 03/26/2020 1014   HDL 37 (L) 03/26/2020 1014   CHOLHDL 5.2 (H) 03/26/2020 1014   LDLCALC 130 (H) 03/26/2020 1014   Clinical Atherosclerotic Cardiovascular Disease (ASCVD): No  The 10-year ASCVD risk score Denman George DC Jr., et al., 2013) is: 10.8%   Values used to calculate the score:     Age: 56 years     Sex: Male     Is Non-Hispanic African American: No     Diabetic: Yes     Tobacco smoker: Yes     Systolic Blood Pressure: 129 mmHg     Is BP treated: No     HDL Cholesterol: 37 mg/dL     Total Cholesterol: 193 mg/dL    Consider increasing metformin to 1000 mg daily Pending GI resolution  A/P: Diabetes currently uncontrolled with an A1C 14.6% however, home sugars are consistently within target goal range with no hypoglycemic events. Medication adherence appears optimal. Instructed patient to discuss with PCP about his GI/unsteady gait at his scheduled visit in October.  -Can consider increasing metformin XR to 1000 mg daily upon GI symptom resolution. -Continued Metformin XR 500 mg daily -Extensively discussed pathophysiology of diabetes, recommended lifestyle interventions, dietary effects on blood sugar control -Counseled on s/sx of and management of hypoglycemia -Next A1C anticipated  October 2021.   ASCVD risk - primary prevention in patient with diabetes. Last LDL of 130 mg/dL is not controlled. ASCVD risk score is not >20%  - moderate or high intensity statin indicated. -Continued Rosuvastatin 20 mg daily  Written patient instructions provided. Total time in face to face counseling 30 minutes.   Follow up PCP Clinic Visit in 1  month.  Fabio Neighbors, PharmD PGY2 Ambulatory Care Resident [facility]  Pharmacy

## 2020-05-28 ENCOUNTER — Encounter: Payer: Self-pay | Admitting: Pharmacist

## 2020-05-28 ENCOUNTER — Ambulatory Visit: Payer: Self-pay | Attending: Primary Care | Admitting: Pharmacist

## 2020-05-28 ENCOUNTER — Other Ambulatory Visit: Payer: Self-pay

## 2020-05-28 DIAGNOSIS — E118 Type 2 diabetes mellitus with unspecified complications: Secondary | ICD-10-CM

## 2020-05-28 DIAGNOSIS — IMO0002 Reserved for concepts with insufficient information to code with codable children: Secondary | ICD-10-CM | POA: Insufficient documentation

## 2020-05-28 DIAGNOSIS — E119 Type 2 diabetes mellitus without complications: Secondary | ICD-10-CM | POA: Insufficient documentation

## 2020-05-28 DIAGNOSIS — E1165 Type 2 diabetes mellitus with hyperglycemia: Secondary | ICD-10-CM

## 2020-05-28 LAB — GLUCOSE, POCT (MANUAL RESULT ENTRY): POC Glucose: 140 mg/dl — AB (ref 70–99)

## 2020-05-28 MED FILL — ROSUVASTATIN CALCIUM 20 MG: 20 | 30 days supply | Qty: 30 | Fill #1

## 2020-05-28 MED FILL — metFORMIN HCL ER 500 MG TB2: 500 | 30 days supply | Qty: 30 | Fill #1

## 2020-06-24 ENCOUNTER — Ambulatory Visit (INDEPENDENT_AMBULATORY_CARE_PROVIDER_SITE_OTHER): Payer: Self-pay | Admitting: Primary Care

## 2020-06-24 ENCOUNTER — Encounter (INDEPENDENT_AMBULATORY_CARE_PROVIDER_SITE_OTHER): Payer: Self-pay | Admitting: Primary Care

## 2020-06-24 ENCOUNTER — Other Ambulatory Visit (INDEPENDENT_AMBULATORY_CARE_PROVIDER_SITE_OTHER): Payer: Self-pay | Admitting: Primary Care

## 2020-06-24 ENCOUNTER — Other Ambulatory Visit: Payer: Self-pay

## 2020-06-24 VITALS — BP 128/78 | HR 78 | Temp 97.3°F | Ht 69.0 in | Wt 188.6 lb

## 2020-06-24 DIAGNOSIS — Z760 Encounter for issue of repeat prescription: Secondary | ICD-10-CM

## 2020-06-24 DIAGNOSIS — E119 Type 2 diabetes mellitus without complications: Secondary | ICD-10-CM

## 2020-06-24 DIAGNOSIS — Z720 Tobacco use: Secondary | ICD-10-CM

## 2020-06-24 DIAGNOSIS — E1165 Type 2 diabetes mellitus with hyperglycemia: Secondary | ICD-10-CM

## 2020-06-24 DIAGNOSIS — E118 Type 2 diabetes mellitus with unspecified complications: Secondary | ICD-10-CM

## 2020-06-24 DIAGNOSIS — IMO0002 Reserved for concepts with insufficient information to code with codable children: Secondary | ICD-10-CM

## 2020-06-24 LAB — POCT GLYCOSYLATED HEMOGLOBIN (HGB A1C): Hemoglobin A1C: 6.9 % — AB (ref 4.0–5.6)

## 2020-06-24 LAB — GLUCOSE, POCT (MANUAL RESULT ENTRY): POC Glucose: 137 mg/dl — AB (ref 70–99)

## 2020-06-24 MED ORDER — METFORMIN HCL ER 500 MG PO TB24: 500.0000 mg | ORAL_TABLET | Freq: Every day | ORAL | 0 refills | Status: DC

## 2020-06-24 MED ORDER — ROSUVASTATIN CALCIUM 20 MG PO TABS: 20.0000 mg | ORAL_TABLET | Freq: Every day | ORAL | 0 refills | Status: DC

## 2020-06-24 MED ORDER — LISINOPRIL 2.5 MG PO TABS
2.5000 mg | ORAL_TABLET | Freq: Every day | ORAL | 1 refills | Status: DC
Start: 2020-06-24 — End: 2020-09-24

## 2020-06-24 MED ORDER — LISINOPRIL 2.5 MG PO TABS
5.0000 mg | ORAL_TABLET | Freq: Every day | ORAL | 1 refills | Status: DC
Start: 2020-06-24 — End: 2020-06-24

## 2020-06-24 MED FILL — ROSUVASTATIN CALCIUM 20 MG: 20 | 30 days supply | Qty: 30 | Fill #0

## 2020-06-24 MED FILL — metFORMIN HCL ER 500 MG TB2: 500 | 30 days supply | Qty: 30 | Fill #0

## 2020-06-24 MED FILL — LISINOPRIL 2.5 MG TABLET: 2.5 | 30 days supply | Qty: 30 | Fill #0

## 2020-06-24 NOTE — Patient Instructions (Signed)

## 2020-06-24 NOTE — Progress Notes (Signed)
[name] is a 44 y.o. male who presents for an follow up} evaluation of Type2 diabetes mellitus.  Current symptoms/problems include none and have been   Current diabetic medications include oral agent (monotherapy): Glucophage.   The patient was initially diagnosed with Type 2 diabetes mellitus based on the following criteria:  AIC   /ADA guidelines   Current monitoring regimen: home blood tests - daily Home blood sugar records: fasting range: 60-140 Any episodes of hypoglycemia? no  Known diabetic complications: none Cardiovascular risk factors: diabetes mellitus, dyslipidemia and smoking/ tobacco exposure Eye exam current (within one year): unknown Weight trend: stable Prior visit with CDE: no Current diet: in general, a "healthy" diet   Current exercise: walking Medication Compliance?  Yes   Is He on ACE inhibitor or angiotensin II receptor blocker?  Yes  lisinopril (Prinivil)   Review of Systems  All other systems reviewed and are negative.   Objective:    BP 128/78 (BP Location: Right Arm, Patient Position: Sitting, Cuff Size: Normal)   Pulse 78   Temp (!) 97.3 F (36.3 C) (Temporal)   Ht 5\' 9"  (1.753 m)   Wt 188 lb 9.6 oz (85.5 kg)   SpO2 97%   BMI 27.85 kg/m   Physical Exam  General: Vital signs reviewed.  Patient is well-developed and well-nourished male  in no acute distress and cooperative with exam.  Head: Normocephalic and atraumatic. Eyes: EOMI, conjunctivae normal, no scleral icterus.  Neck: Supple, trachea midline, normal ROM, no JVD, masses, thyromegaly, or carotid bruit present.  Cardiovascular: RRR, S1 normal, S2 normal, no murmurs, gallops, or rubs. Pulmonary/Chest: Clear to auscultation bilaterally, no wheezes, rales, or rhonchi. Abdominal: Soft, non-tender, non-distended, BS +, no masses, organomegaly, or guarding present.  Musculoskeletal: No joint deformities, erythema, or stiffness, ROM full and nontender. Extremities: No lower  extremity edema bilaterally,  pulses symmetric and intact bilaterally. No cyanosis or clubbing. Neurological: A&O x3, Skin: Warm, dry and intact. No rashes or erythema. Psychiatric: Normal mood and affect. speech and behavior is normal. Cognition and memory are normal.  Lab Review Glucose, Bld (mg/dL)  Date Value  366 (H)  11/25/2017 146 (H)  11/25/2017 146 (H)   CO2 (mmol/L)  Date Value  03/12/2020 24  11/25/2017 23   BUN (mg/dL)  Date Value  11/27/2017 12  11/25/2017 9  11/25/2017 9   Creatinine, Ser (mg/dL)  Date Value  11/27/2017 0.61  11/25/2017 0.70  11/25/2017 0.83      Assessment:    Diabetes Mellitus type II, under excellent control.   There are no diagnoses linked to this encounter.    Plan:     Diabetes mellitus type 2, uncontrolled, with complications (HCC) 1.  Rx changes: none 2.  Education: Reviewed 'ABCs' of diabetes management (respective goals in parentheses):  A1C (<7), blood pressure (<130/80), and cholesterol (LDL <100).  3. CHO counting diet discussed.  Reviewed CHO amount in various foods and how to read nutrition labels.  Discussed recommended serving sizes.  5.  Recommend check BG 1  times a day 6.  Recommended increase physical activity - goal is 150 minutes per week -     HM Diabetes Foot Exam -     HM Diabetes Eye Exam  Comprehensive diabetic foot examination, type 2 DM, encounter for [facility]) Completed   Tobacco abuse He has increased risk for lung cancer and other respiratory diseases recommend cessation.  This will be reminded at each clinical visit.  Other ordersMedication refill --     lisinopril (ZESTRIL) 2.5 MG tablet; Take 1 tablet (2.5 mg total) by mouth daily. -     rosuvastatin (CRESTOR) 20 MG tablet; Take 1 tablet (20 mg total) by mouth daily.

## 2020-08-04 MED FILL — metFORMIN HCL ER 500 MG TB2: 500 | 30 days supply | Qty: 30 | Fill #1

## 2020-08-04 MED FILL — LISINOPRIL 2.5 MG TABLET: 2.5 | 30 days supply | Qty: 30 | Fill #1

## 2020-08-04 MED FILL — ROSUVASTATIN CALCIUM 20 MG: 20 | 30 days supply | Qty: 30 | Fill #1

## 2020-09-24 ENCOUNTER — Ambulatory Visit (INDEPENDENT_AMBULATORY_CARE_PROVIDER_SITE_OTHER): Payer: Self-pay | Admitting: Primary Care

## 2020-09-24 ENCOUNTER — Other Ambulatory Visit: Payer: Self-pay

## 2020-09-24 ENCOUNTER — Encounter (INDEPENDENT_AMBULATORY_CARE_PROVIDER_SITE_OTHER): Payer: Self-pay | Admitting: Primary Care

## 2020-09-24 ENCOUNTER — Other Ambulatory Visit (INDEPENDENT_AMBULATORY_CARE_PROVIDER_SITE_OTHER): Payer: Self-pay | Admitting: Primary Care

## 2020-09-24 DIAGNOSIS — E782 Mixed hyperlipidemia: Secondary | ICD-10-CM

## 2020-09-24 DIAGNOSIS — Z760 Encounter for issue of repeat prescription: Secondary | ICD-10-CM

## 2020-09-24 DIAGNOSIS — I10 Essential (primary) hypertension: Secondary | ICD-10-CM

## 2020-09-24 DIAGNOSIS — E119 Type 2 diabetes mellitus without complications: Secondary | ICD-10-CM

## 2020-09-24 MED ORDER — METFORMIN HCL ER 500 MG PO TB24: 500.0000 mg | ORAL_TABLET | Freq: Every day | ORAL | 1 refills | Status: DC

## 2020-09-24 MED ORDER — ROSUVASTATIN CALCIUM 20 MG PO TABS: 20.0000 mg | ORAL_TABLET | Freq: Every day | ORAL | 1 refills | Status: DC

## 2020-09-24 MED ORDER — LISINOPRIL 2.5 MG PO TABS: 2.5000 mg | ORAL_TABLET | Freq: Every day | ORAL | 1 refills | Status: DC

## 2020-09-24 MED FILL — LISINOPRIL 2.5 MG TABLET: 2.5 | 30 days supply | Qty: 30 | Fill #0

## 2020-09-24 MED FILL — ROSUVASTATIN CALCIUM 20 MG: 20 | 30 days supply | Qty: 30 | Fill #0

## 2020-09-24 MED FILL — metFORMIN HCL ER 500 MG TB2: 500 | 30 days supply | Qty: 30 | Fill #0

## 2020-09-24 NOTE — Progress Notes (Signed)
Virtual Visit via Telephone Note  I connected with Frederick Cervantes on 09/24/20 at  9:30 AM EST by telephone and verified that I am speaking with the correct person using two identifiers.  Location: Patient: home  Provider: Grayce Sessions @[facility]    I discussed the limitations, risks, security and privacy concerns of performing an evaluation and management service by telephone and the availability of in person appointments. I also discussed with the patient that there may be a patient responsible charge related to this service. The patient expressed understanding and agreed to proceed.   History of Present Illness: [name] is a 45 year old male having f/u for the management of T2D. He has done remarkable from A1C 14.6  to 6.9. He is requesting medication refills out of all medications.  Compliant with meds - Yes ( until no rf) Checking CBGs? Yes Fasting avg - 91-184 Exercising regularly? - Yes, Watching carbohydrate intake? - Yes, Neuropathy ? - No Hypoglycemic events - No  Past Medical History:  Diagnosis Date  . Diabetes mellitus without complication (HCC)   . Hypertension   . Ulcer, stomach peptic    Current Outpatient Medications on File Prior to Visit  Medication Sig Dispense Refill  . acetaminophen (TYLENOL) 500 MG tablet Take 500-1,000 mg by mouth every 6 (six) hours as needed (for headaches).  (Patient not taking: Reported on 09/24/2020)    . aspirin EC 81 MG tablet Take 81 mg by mouth daily as needed ("for hypertension").  (Patient not taking: Reported on 09/24/2020)    . Blood Glucose Monitoring Suppl (ACCU-CHEK AVIVA) device Use as instructed (Patient not taking: Reported on 09/24/2020) 1 each 0  . glucose blood test strip Check 3x a day. (Patient not taking: Reported on 09/24/2020) 100 each 12  . omeprazole (PRILOSEC) 20 MG capsule Take 1 capsule (20 mg total) by mouth daily. (Patient not taking: Reported on 09/24/2020) 30 capsule 0  . TRUEplus Lancets 28G MISC Use  as instructed to check blood sugar three times daily. (Patient not taking: Reported on 09/24/2020) 100 each 2   No current facility-administered medications on file prior to visit.   Pertinent ROS:  Polyuria - No Polydipsia - No Vision problems - No   Observations/Objective: Frederick Cervantes was seen today for diabetes.  Diagnoses and all orders for this visit: Assessment and Plan:   Medication refill -     lisinopril (ZESTRIL) 2.5 MG tablet; Take 1 tablet (2.5 mg total) by mouth daily. -     metFORMIN (GLUCOPHAGE-XR) 500 MG 24 hr tablet; Take 1 tablet (500 mg total) by mouth daily with breakfast. -     rosuvastatin (CRESTOR) 20 MG tablet; Take 1 tablet (20 mg total) by mouth daily.  Type 2 diabetes mellitus without complication, unspecified whether long term insulin use (HCC) : Goal of therapy: Less than 6.5 hemoglobin A1c.  Continue to monitor foods that are high in carbohydrates are the following rice, potatoes, breads, sugars, and pastas.  Reduction in the intake (eating) will assist in lowering your blood sugars. -     metFORMIN (GLUCOPHAGE-XR) 500 MG 24 hr tablet; Take 1 tablet (500 mg total) by mouth daily with breakfast.  Mixed hyperlipidemia Continue to  decrease your fatty foods, red meat, cheese, milk and increase fiber like whole grains and veggies.  -     rosuvastatin (CRESTOR) 20 MG tablet; Take 1 tablet (20 mg total) by mouth daily.  Essential hypertension Renal protection  -  lisinopril (ZESTRIL) 2.5 MG tablet; Take 1 tablet (2.5 mg total) by mouth daily.   Follow Up Instructions:    I discussed the assessment and treatment plan with the patient. The patient was provided an opportunity to ask questions and all were answered. The patient agreed with the plan and demonstrated an understanding of the instructions.   The patient was advised to call back or seek an in-person evaluation if the symptoms worsen or if the condition fails to improve as anticipated.  I provided 20  minutes of non-face-to-face time during this encounter.   Grayce Sessions, NP

## 2020-09-24 NOTE — Progress Notes (Signed)
I connected with  Frederick Cervantes on 09/24/20 by a video enabled telemedicine application and verified that I am speaking with the correct person using two identifiers.   I discussed the limitations of evaluation and management by telemedicine. The patient expressed understanding and agreed to proceed.   CBG last night after eating 165 Pt does not check CBG daily  Last A1c was 6.9 in oct 2021

## 2020-11-04 MED FILL — metFORMIN HCL ER 500 MG TB2: 500 | 30 days supply | Qty: 30 | Fill #1

## 2020-11-04 MED FILL — ROSUVASTATIN CALCIUM 20 MG: 20 | 30 days supply | Qty: 30 | Fill #1

## 2020-11-04 MED FILL — LISINOPRIL 2.5 MG TABLET: 2.5 | 30 days supply | Qty: 30 | Fill #1

## 2021-01-13 ENCOUNTER — Ambulatory Visit (INDEPENDENT_AMBULATORY_CARE_PROVIDER_SITE_OTHER): Payer: Self-pay | Admitting: Primary Care

## 2021-02-04 ENCOUNTER — Other Ambulatory Visit: Payer: Self-pay

## 2021-02-04 ENCOUNTER — Other Ambulatory Visit: Payer: Self-pay | Admitting: *Deleted

## 2021-02-04 ENCOUNTER — Ambulatory Visit (INDEPENDENT_AMBULATORY_CARE_PROVIDER_SITE_OTHER): Payer: Self-pay | Admitting: *Deleted

## 2021-02-04 DIAGNOSIS — Z760 Encounter for issue of repeat prescription: Secondary | ICD-10-CM

## 2021-02-04 DIAGNOSIS — E119 Type 2 diabetes mellitus without complications: Secondary | ICD-10-CM

## 2021-02-04 MED ORDER — METFORMIN HCL ER 500 MG PO TB24
ORAL_TABLET | Freq: Every day | ORAL | 1 refills | Status: DC
Start: 2021-02-04 — End: 2021-05-28
  Filled 2021-02-04: qty 30, 30d supply, fill #0
  Filled 2021-02-27: qty 30, 30d supply, fill #1
  Filled 2021-05-07: qty 30, 30d supply, fill #2

## 2021-02-04 NOTE — Telephone Encounter (Signed)
Pt requested refill of metformin and "Med for my kidneys, can't remember name." Reviewed all meds with pt,current profile and past,  stated "I guess just my metformin."  Stated he checked with pharmacy and no refills available. Refilled med.

## 2021-02-05 ENCOUNTER — Other Ambulatory Visit: Payer: Self-pay

## 2021-02-05 NOTE — Telephone Encounter (Signed)
Patient is not on any medication for kidneys

## 2021-02-05 NOTE — Telephone Encounter (Signed)
Please advise 

## 2021-02-25 ENCOUNTER — Other Ambulatory Visit: Payer: Self-pay

## 2021-02-25 ENCOUNTER — Ambulatory Visit (INDEPENDENT_AMBULATORY_CARE_PROVIDER_SITE_OTHER): Payer: Self-pay | Admitting: Primary Care

## 2021-02-25 ENCOUNTER — Encounter (INDEPENDENT_AMBULATORY_CARE_PROVIDER_SITE_OTHER): Payer: Self-pay | Admitting: Primary Care

## 2021-02-25 VITALS — BP 124/74 | HR 73 | Resp 16

## 2021-02-25 DIAGNOSIS — E1165 Type 2 diabetes mellitus with hyperglycemia: Secondary | ICD-10-CM

## 2021-02-25 DIAGNOSIS — R519 Headache, unspecified: Secondary | ICD-10-CM

## 2021-02-25 DIAGNOSIS — IMO0002 Reserved for concepts with insufficient information to code with codable children: Secondary | ICD-10-CM

## 2021-02-25 DIAGNOSIS — E118 Type 2 diabetes mellitus with unspecified complications: Secondary | ICD-10-CM

## 2021-02-25 MED ORDER — TRULICITY 0.75 MG/0.5ML ~~LOC~~ SOAJ
0.7500 mg | SUBCUTANEOUS | 3 refills | Status: DC
  Filled 2021-02-25: qty 0.5, 7d supply, fill #0

## 2021-02-25 NOTE — Patient Instructions (Signed)
Dulaglutide Injection ?? ??? ??????? ????? ?????????? ?? ??? ???? ??? ??????? ???????? ???? ?????? ?? ????? 2. ??? ???????? ?? ??????? ?? ??? ??????? ??? ?????? ??????? ??????? ???????. ?????? ?? ???? ??? ???? ??????? ???? ????? ??? ???? ?? ????????. ????? ??? ????????????? ??????? ?? ?????? ????????. ???? ??????? ??? ?????? ?????? ????? ???? ???? ??????? ?????? ?? ??????? ??????? ???? ?????. ????? ???????? ???????? ????????:? Trulicity ?? ?? ??????? ???? ??? ?? ???? ??? ???? ??????? ?????? ??? ????? ??? ??????? ?? ????? ??? ????? ?? ??? ??? ?????? ??? ?? ??? ??????? ????: ????? ????? ?????? (MEN2) ?? ?? ??? ??? ?? ?? ?????? ?????? ???? ??????? ????? ?????? ????? ?????? ????? ?? ?????? ????????? ????? ????? ????? ????? ????? ?????? ?? ??????? ????? ????? ??????? ?? ?? ??? ??? ?? ?? ?????? ?????? ?????? ????? ??????? ?? ??? ??? ???? ?? ?????? ?? ??????????? ?? ??? ??? ???? ?? ?????? ?? ????? ???? ?? ??? ??? ???? ?? ?????? ?? ??????? ?? ??????? ?? ?????? ??????? ???? ?? ????? ????? ??????? ???????? ??? ??? ???? ??????? ??? ??????? ????? ??? ?????? ??? ?????. ???? ?????? ????? ?????? ???????. ?????? ?????? ?????? ??? ???? ?????? ?????? ?? ??? ????? ?? ?? ?????. ?? ??? ?????? ?????? ?? ?????? ?????????. ????? ?? ????? ????? ????? ?????? ?? ?? ???? ??? ????? ???????? ??????? ?????? ?????? ?? ???????. ??? ??? ?????? ??? ?????? ?? ?????????? ?????? ???? ???? ???? ????? ?? ?????????. ?? ????????? ????. ?? ??? ???? ??? ???????? ?????? ??? ??? ???????????. ?? ?????? ????? ????? (?????????) ?? ?? ??? ???? ???? ??????. ???? ??? ?????? ?? ??????? ?????????. ???? ??????? ?? ??????? ??? ????? ??????? ??? ??????. ???? ?? ????? ??? ????????? ??????. ???? ??? ??????? ?? ????????????? ?????? ??? ???? ???? ?????????. ?? ????? ?? ??? ????? ?????? ????????? ?? ???? ???? ?????? ?? ??????? ??????. ?? ????? ?? ????? ?????. ??? ?? ??? ???? ???? ??????? ??????? ???? ??????? ???????? ??????? ?????? ?????? ???  ?????. ????? ??????? ??????? ???? ??? ??? ??? ??? ?? ???? ???? ????? ??????. ???? ??????? ??? ????????? ?????? ?? ?? ???. ???? ??? ????? ?? ?????? ??????? ?????? ???? ??????? ??? ?????? ???????. ?????? ???? ???? ??? ????? ????. ?????? ??????? : ??? ?????? ??? ?????? ???? ????? ???? ?? ??? ??????? ????????? ?????? ?? ?????? ?? ???? ??????? ?????. ??????: ?????? ??? ?????? ?? ???? ??? ???. ?? ????? ????? ??? ?? ????? ?????????. ???? ?? ???? (????) ????? ??? ????? ????? ??????? ?? ???? ??? ???? ??? ??? ????? ???? ?? 3 ???? ?? ??????. ??? ????? ???? ?? 3 ???? ?? ???? ??????? ?????? ?????? ???????. ???????????? ??????? ?? ?????? ???????. ?? ?? ??????? ???? ?? ?????? ?? ??? ??????? ????? ???? ???? ????? ?? ???? ??? ????? ?????? ?? ??? ????? ??? ??? ???????: ????????? ???? ????? ??? ?????? ????? ???? ???? ????? ??? ??????? ??????? ?? ?????? ?????? ???????? ???????? ???????? ??? ????? ??? ???? ??????? ?? ????? ????? ?? ??? ?????? ??? ???? ????? ?????? ????? ????? ??????? ????? ??? ???????????? ?? ?????????????? ?????? ??? ?????. ?????????? ???????????? ??????????? ????????? ??????? ??????? ?? ???????? ???????? ??????? ???? ???? ????? ???????? ??????? ??????? MAOI ??? ?????? ??????? ???????? ????????? ???????. ????? ???????? ?? ????? ?? ????? ????? ?? ?????? ????? ???? ???????? ?? ????? ?? ?????????? ??????? ????? ????? ????? ?????? ????????? ??????? ??? ?????????? ??????? ?????????? NSAIDs? ????? ????? ????? ?? ?????????? ??? ?????????? ?????????? ??????????? ??????????? ????????? ???????? ?????????? ?????? ?????????? ??????? ??? ?????????????? ?? ????????????? ?? ?????????? ??? ?????? ?????? ??????? ??????? ?????????? ??? ????????? ?? ?????????? ??????????- ?????????????? ??????? ????? ??????? ??? ??????? ???? ???? ?? ???? ????? ??????? ?? ??? ???? ??????? ?? ????? ??? ??? ????? ????? ????? ????? ??? ??? ????? ??????? ???? ??? ??? ?????? ???? ??????????. ???? ??? ???????: ??????  ??????? ????????? ?? ?????? ????? ??? ???? ??????? (???? ??????? ???????? ?? ?????????? ?? ???????????) ????????? ?????????? ??????? ??? ??????? ?? ?? ??? ?? ????????? ????????. ?? ?????? ???? ??????? ?????? ????? ??? ??????? ?? ??????? ?? ??????? ???? ???? ?? ???????? ???????? ???? ????????. ?????? ????? ??? ??? ???? ?? ???? ?????? ?? ?????? ?????? ??????????. ?? ?????? ??? ??????? ?? ?????. ?? ???? ??? ???? ????? ??? ??????? ??? ??????? ?? ?????? ????? ?? ?????? ??????? ?????? ??????? ????? ???????. ???? ?????? ?? ?????? ??????? ?????? ??? ???? ?????? ????? ?? ?????? ?? ??? ?? ??? ??? ???? ??????. ????? ???? ????? ?? ????? ????? ???? ?? ???? ?? ?????????? ??? ??????. ???? ?????? ????? ?????? ???? ??????? ???????????? ????????????? (  A1C). ??? ?????? ?? ????. ???? ??? ?????? ?? ????? ??? ???? ???? ???? ??????? ??? 3 ????????????. ????? ???? ???????? ?? 3 ??? 6 ????. ???? ??? ???? ????? ??? ???? ????. ???? ????? ??? ???? ??????? ???????? ????????????? ????. ???? ????? ???? ???? ????? ??? ?? ???? ?????? ?????? ?????? ??? ????. ?? ????? ??? ??????? ???? ????? ?????? ?? ????? ????????. ????? ?? ?? ??????? ?????? ???? ?? ?????? ?? ????? ??? ???? ?? ???? ???? ?????? ?????? ????? ??????? ???????? ??? ??????? ?? ????? ?????. ???? ??? ?????? ???? ?? ?????. ???? ????? ?? ?????? ??????? ?????? ??? ????? ??????? ?? ??? ????. ?? ????? ??? ????? ???? ??? ??????. ??? ???? ???????? ?? ????? ?????? ???? ?? ???????? ???????? ??? ????? ???? ??? ??????. ?? ???? ?????. ???? ????? ?? ?????? ??????? ?????? ??? ??? ????? ???? ???? ??????. ????? ?????? ?? ?????? ?????? ?????? ??????? ???? ???? ??? ????? ????????. ?? ???? ??? ??? ????? ??? ???? ????. ??? ??? ??? ????? ?????? ??????? ?? ??? ???. ??? ??? ?? ????? ??????? ?? ????? ???????? ?? ??? ????????? ??? ????? ???????? ?????? ?? ????? ??? ??????????????. ????? ????? ?? ????? ????? ????. ???? ????? ??? ?????. ?? ????? ???????????????? ???? ???????? ??? ???????. ?? ?? ??????  ???????? ???? ???? ?? ??????? ??? ???? ??? ??????? ?????? ???????? ???? ??? ?? ???? ????? ?? ?????? ??????? ?????? ??? ?? ???? ???????: ??????? ???????? (????? ?????? ?? ????? ?? ?????????? (?????)? ?? ???? ????? ?? ?????? ?? ??????) ?????? ?? ?????? ??????? ??????? ?? ?????? ???? (???? ???????? ????? ?????? ?????? ???? ???? ?? ????? ?? ??????) ????? ????? (????? ?? ?????? ?? ?????? ?? ???? ?????) ?????? ????? ?? ???? (?????? ??????? ????????? ??????? ????? ?????? ????? ?? ????? ???? ??? ????? ????? ??????? ????????? ?????? ????? ?????? ???? ?????? ??????? ??? ???? ??????? ???? ????? ?????? ????? ?????) ???? ???? ?? ??? ??????? ????? ?????? ????? ?? ????? ?????? ?? ??? ??? ?????? ?? ?????? ??? ?????? ???????? ???? ?? ????? ????? ???? (???? ????? ?? ?????? ??????? ????????? ?????? ?? ???? ????? ?????): ??? ?? ????? ?????? ??????? ??? ?? ?????? ?? ??? ?? ?????? ?? ????? ?????? ??? ??????? ?? ?? ??? ?? ?????? ???????? ????????. ???? ?????? ????????? ?????? ?? ?????? ????????. ????? ??????? ?? ?????? ???????? ?????? ??????? ????????????????? (FDA) ??? ????? [phone]??? ??? ??? ???? ???????? ??????? ???? ?????? ?? ?????? ??????? ?? ????????? ???????. ??????? (?????): ???? ??????? ??? ???????? ?? ????? ??? 2 ?8 ????? ????? (36 ?46 ???? ????????). ????? ?? ?? ???? ??????? ??????? ??? ???? ??????? ???????. ?? ???? ?? ?????? ??? ?????? ??? ??? ??????. ???? ????? ?? ?????. ????? ?? ?????? ??? ?????? ??? ????? ?????? ???????? ??????? ??? ??????. ???? ????? ??????: ???? ????? ????? ?? ???? ????? ?????? ?? ??? ?? 30 ???? ????? (86 ???? ????????) ???? ??? ??? 14 ????? ??? ??? ?????. ???? ????? ?? ?????. ???? ?????? ??????? ???????. ??? ?????? ?? ???? ????? ??????? ????? ???? ???? ??? ?????? ??? 14 ????? ?? ??? ?????? ???????? ????? ????. ?????? ?? ??????? ???? ?? ??? ???? ???? ????? ?? ?????? ????????: ?? ?????? ??? ?????? ????? ??????? ??? ??????. ???? ?? ???????? ?? ???? ????? ??????? ?????? ???  ????. ??? ?? ????? ?? ????? ??????? ????? ??????? ?? ?????? ??????? ?????? ?? ????? ?????? ?? ??? ?????? ?????. ??????: ??? ??????? ????? ?? ????: ?? ?? ???? ??? ???????? ?? ????????? ???????. ??? ???? ???? ?? ????? ?? ??? ??????? ????? ??? ?????? ?? ??????? ?????? ??????? ??????.  2022 Elsevier/Gold Standard (2020-09-25 00:00:00)

## 2021-02-25 NOTE — Progress Notes (Signed)
Acute Office Visit  Subjective:    Patient ID: Frederick Cervantes, male    DOB: 22-Oct-1975, 45 y.o.   MRN: [id number]  Chief Complaint  Patient presents with   Headache    Headache  Associated symptoms include photophobia.  Frederick Cervantes, is a 45 y/o male   who presents with headaches for the last 3 days some relief with OTC Tylenol and ASA. He has pressure behind his eyes , frontal/ occipital area  Also sensitivity to light and noises .  Past Medical History:  Diagnosis Date   Diabetes mellitus without complication (HCC)    Hypertension    Ulcer, stomach peptic     Past Surgical History:  Procedure Laterality Date   NO PAST SURGERIES      Family History  Problem Relation Age of Onset   Diabetes Neg Hx     Social History   Socioeconomic History   Marital status: Single    Spouse name: Not on file   Number of children: Not on file   Years of education: Not on file   Highest education level: Not on file  Occupational History   Not on file  Tobacco Use   Smoking status: Every Day    Packs/day: 1.00    Pack years: 0.00    Types: Cigarettes   Smokeless tobacco: Never  Substance and Sexual Activity   Alcohol use: Never   Drug use: Never   Sexual activity: Not on file  Other Topics Concern   Not on file  Social History Narrative   Not on file   Social Determinants of Health   Financial Resource Strain: Not on file  Food Insecurity: Not on file  Transportation Needs: Not on file  Physical Activity: Not on file  Stress: Not on file  Social Connections: Not on file  Intimate Partner Violence: Not on file    Outpatient Medications Prior to Visit  Medication Sig Dispense Refill   aspirin EC 81 MG tablet Take 81 mg by mouth daily as needed ("for hypertension").     metFORMIN (GLUCOPHAGE-XR) 500 MG 24 hr tablet TAKE 1 TABLET (500 MG TOTAL) BY MOUTH DAILY WITH BREAKFAST. 90 tablet 1   acetaminophen (TYLENOL) 500 MG tablet Take 500-1,000 mg by mouth  every 6 (six) hours as needed (for headaches).  (Patient not taking: Reported on 09/24/2020)     lisinopril (ZESTRIL) 2.5 MG tablet TAKE 1 TABLET (2.5 MG TOTAL) BY MOUTH DAILY. (Patient not taking: Reported on 02/25/2021) 90 tablet 1   omeprazole (PRILOSEC) 20 MG capsule Take 1 capsule (20 mg total) by mouth daily. (Patient not taking: No sig reported) 30 capsule 0   rosuvastatin (CRESTOR) 20 MG tablet TAKE 1 TABLET (20 MG TOTAL) BY MOUTH DAILY. (Patient not taking: Reported on 02/25/2021) 90 tablet 1   Blood Glucose Monitoring Suppl (ACCU-CHEK AVIVA) device Use as instructed (Patient not taking: Reported on 09/24/2020) 1 each 0   glucose blood test strip Check 3x a day. (Patient not taking: Reported on 09/24/2020) 100 each 12   TRUEplus Lancets 28G MISC Use as instructed to check blood sugar three times daily. (Patient not taking: Reported on 09/24/2020) 100 each 2   No facility-administered medications prior to visit.    Not on File  Review of Systems  Eyes:  Positive for photophobia.  Neurological:  Positive for headaches.  All other systems reviewed and are negative.     Objective:    Physical Exam Vitals  reviewed.  Constitutional:      Appearance: He is well-developed.  HENT:     Head: Normocephalic.  Eyes:     Extraocular Movements: Extraocular movements intact.     Pupils: Pupils are equal, round, and reactive to light.  Cardiovascular:     Rate and Rhythm: Normal rate and regular rhythm.  Pulmonary:     Effort: Pulmonary effort is normal.     Breath sounds: Normal breath sounds.  Abdominal:     General: Bowel sounds are normal.     Palpations: Abdomen is soft.  Musculoskeletal:        General: Normal range of motion.     Cervical back: Normal range of motion.  Skin:    General: Skin is warm and dry.  Neurological:     Mental Status: He is alert.  Psychiatric:        Mood and Affect: Mood normal.        Behavior: Behavior normal.   BP 124/74 (BP Location: Left Arm,  Patient Position: Sitting, Cuff Size: Large)   Pulse 73   Resp 16   SpO2 97%  Wt Readings from Last 3 Encounters:  06/24/20 188 lb 9.6 oz (85.5 kg)  03/24/20 194 lb (88 kg)  03/12/20 185 lb 3 oz (84 kg)    Health Maintenance Due  Topic Date Due   PNEUMOCOCCAL POLYSACCHARIDE VACCINE AGE 103-64 HIGH RISK  Never done   COVID-19 Vaccine (1) Never done   Pneumococcal Vaccine 22-43 Years old (1 - PCV) Never done   HIV Screening  Never done   Hepatitis C Screening  Never done   HEMOGLOBIN A1C  12/23/2020    There are no preventive care reminders to display for this patient.   No results found for: TSH Lab Results  Component Value Date   WBC 9.8 03/12/2020   HGB 16.6 03/12/2020   HCT 47.7 03/12/2020   MCV 85.8 03/12/2020   PLT 254 03/12/2020   Lab Results  Component Value Date   NA 134 (L) 03/12/2020   K 4.1 03/12/2020   CO2 24 03/12/2020   GLUCOSE 366 (H) 03/12/2020   BUN 12 03/12/2020   CREATININE 0.61 03/12/2020   BILITOT 0.7 11/25/2017   ALKPHOS 93 11/25/2017   AST 19 11/25/2017   ALT 23 11/25/2017   PROT 7.5 11/25/2017   ALBUMIN 4.0 11/25/2017   CALCIUM 9.2 03/12/2020   ANIONGAP 11 03/12/2020   Lab Results  Component Value Date   CHOL 193 03/26/2020   Lab Results  Component Value Date   HDL 37 (L) 03/26/2020   Lab Results  Component Value Date   LDLCALC 130 (H) 03/26/2020   Lab Results  Component Value Date   TRIG 143 03/26/2020   Lab Results  Component Value Date   CHOLHDL 5.2 (H) 03/26/2020   Lab Results  Component Value Date   HGBA1C 6.9 (A) 06/24/2020       Assessment & Plan:  Frederick Cervantes was seen today for headache.  Diagnoses and all orders for this visit:  Diabetes mellitus type 2, uncontrolled, with complications (HCC) ADA recommends the following therapeutic goals for glycemic control related to A1c measurements: Goal of therapy: Less than 6.5 hemoglobin A1c.  Reference clinical practice recommendations. Foods that are high in carbohydrates  are the following rice, potatoes, breads, sugars, and pastas.  Reduction in the intake (eating) will assist in lowering your blood sugars.  -     Dulaglutide (TRULICITY) 0.75 MG/0.5ML SOPN; Inject 0.75  mg into the skin once a week.  New onset of headaches  Discussed triggers can be caused by drinking alcohol smoking or certain medications, eating and drinking certain products  This condition may be triggered or caused by: Caffeine, aged cheese, and chocolate. Asked to associate headache with what he is doing at the time .  Naproxen prn  Grayce Sessions, NP

## 2021-02-25 NOTE — Progress Notes (Signed)
Headache x 3 days Tylenol and ASA Has pressure  Sensitivity to light and sound

## 2021-02-26 ENCOUNTER — Other Ambulatory Visit (INDEPENDENT_AMBULATORY_CARE_PROVIDER_SITE_OTHER): Payer: Self-pay | Admitting: Primary Care

## 2021-02-26 ENCOUNTER — Other Ambulatory Visit: Payer: Self-pay

## 2021-02-26 ENCOUNTER — Other Ambulatory Visit: Payer: Self-pay | Admitting: Primary Care

## 2021-02-26 DIAGNOSIS — IMO0002 Reserved for concepts with insufficient information to code with codable children: Secondary | ICD-10-CM

## 2021-02-26 DIAGNOSIS — E1165 Type 2 diabetes mellitus with hyperglycemia: Secondary | ICD-10-CM

## 2021-02-26 MED ORDER — TRULICITY 0.75 MG/0.5ML ~~LOC~~ SOAJ
0.7500 mg | SUBCUTANEOUS | 3 refills | Status: DC
  Filled 2021-02-26: qty 2, 28d supply, fill #0

## 2021-02-26 NOTE — Telephone Encounter (Signed)
  Notes to clinic: This is written for 1 pen, we can not open the box b/c it's too expensive....please resend over for a full box.   Requested Prescriptions  Pending Prescriptions Disp Refills   Dulaglutide (TRULICITY) 0.75 MG/0.5ML SOPN 0.5 mL 3    Sig: Inject 0.75 mg into the skin once a week.      Endocrinology:  Diabetes - GLP-1 Receptor Agonists Failed - 02/26/2021  8:04 AM      Failed - HBA1C is between 0 and 7.9 and within 180 days    Hemoglobin A1C  Date Value Ref Range Status  06/24/2020 6.9 (A) 4.0 - 5.6 % Final          Passed - Valid encounter within last 6 months    Recent Outpatient Visits           Yesterday Diabetes mellitus type 2, uncontrolled, with complications (HCC)   [facility] Grayce Sessions, NP   5 months ago Medication refill   [facility] [name] P, NP   8 months ago Diabetes mellitus type 2, uncontrolled, with complications (HCC)   [facility] [name] P, NP   9 months ago Diabetes mellitus type 2, uncontrolled, with complications [facility])   [place] [facility] Cash, Jeannett Senior L, RPH-CPP   10 months ago Diabetes mellitus type 2, uncontrolled, with complications [facility])   [facility] Drucilla Chalet, RPH-CPP       Future Appointments             In 1 month Lois Huxley, Cornelius Moras, RPH-CPP [facility]   In 3 months Randa Evens, Kinnie Scales, NP [facility]

## 2021-02-27 ENCOUNTER — Other Ambulatory Visit (INDEPENDENT_AMBULATORY_CARE_PROVIDER_SITE_OTHER): Payer: Self-pay | Admitting: Emergency Medicine

## 2021-02-27 ENCOUNTER — Other Ambulatory Visit: Payer: Self-pay | Admitting: Pharmacist

## 2021-02-27 ENCOUNTER — Other Ambulatory Visit: Payer: Self-pay

## 2021-02-27 DIAGNOSIS — E119 Type 2 diabetes mellitus without complications: Secondary | ICD-10-CM

## 2021-02-27 MED ORDER — TRUE METRIX BLOOD GLUCOSE TEST VI STRP
ORAL_STRIP | 2 refills | Status: DC
  Filled 2021-02-27: qty 100, 100d supply, fill #0

## 2021-02-27 MED ORDER — TRUEPLUS LANCETS 28G MISC
2 refills | Status: DC
  Filled 2021-02-27: qty 100, 100d supply, fill #0

## 2021-02-27 MED ORDER — TRUE METRIX METER W/DEVICE KIT
PACK | 0 refills | Status: AC
  Filled 2021-02-27: qty 1, 365d supply, fill #0

## 2021-02-27 MED FILL — Rosuvastatin Calcium Tab 20 MG: ORAL | 30 days supply | Qty: 30 | Fill #0 | Status: AC

## 2021-03-04 ENCOUNTER — Other Ambulatory Visit: Payer: Self-pay

## 2021-03-05 ENCOUNTER — Other Ambulatory Visit: Payer: Self-pay

## 2021-03-08 MED ORDER — TRULICITY 0.75 MG/0.5ML ~~LOC~~ SOAJ
0.7500 mg | SUBCUTANEOUS | 3 refills | Status: DC
  Filled 2021-03-08: qty 0.5, 7d supply, fill #0
  Filled 2021-03-27: qty 2, 28d supply, fill #0
  Filled 2021-05-07: qty 2, 28d supply, fill #1

## 2021-03-08 MED ORDER — NAPROXEN 500 MG PO TABS
500.0000 mg | ORAL_TABLET | Freq: Two times a day (BID) | ORAL | 0 refills | Status: DC
  Filled 2021-03-08: qty 20, 10d supply, fill #0

## 2021-03-10 ENCOUNTER — Other Ambulatory Visit: Payer: Self-pay

## 2021-03-17 ENCOUNTER — Other Ambulatory Visit: Payer: Self-pay

## 2021-03-27 ENCOUNTER — Other Ambulatory Visit: Payer: Self-pay

## 2021-04-06 NOTE — Progress Notes (Unsigned)
   S:     PCP: [name], NP  PMH: T2DM Patient diagnosed with diabetes in July 2021.  Patient arrives in good spirits. Presents for diabetes evaluation and medication management. Patient was referred and last seen by Primary Care Provider, [name], NP on 02/25/21 via telephone visit and last seen in pharmacy clinic on 05/28/20. At PCP visit, Trulicity 0.75 mg weekly was started.  Today, *** A1C = last in October? POC A1c, labs: CMP, UACR, lipid panel Check Clinic BG Review medications and adherence (timing of meds, etc.)  Ate or drank anything prior to visit today? At home BGs?  Highs  Lows  Hyperglycemia sx (nocturia, neuropathy, visual changes, foot exams) Hypoglycemia symptoms (dizziness, shaky, sweating, hungry, confusion) Diet Exercise   Family/Social History:  -Tobacco: Current every day smoker - has smoked tobacco for about 22 years, however has now decreased from 2 ppd to 1 ppd. Denies alcohol and illicit drug use   Insurance coverage/medication affordability: Medicaid (family planning only)   Medication adherence reported. Current diabetes medications include: Metformin XR 500 mg daily, Trulicity 0.75 mg weekly Current renal protective medications include: Lisinopril 2.5 mg daily Current hyperlipidemia medications include: Rosuvastatin 20 mg daily  Patient denies hypoglycemic events.  Patient reported dietary habits: Now fasting for 20 hours and eats a low carb or ketogenic diet. Eats vegetables, fish, chicken, drinks zero diet soda; avoids sugars, pasta, fried and fast food. Patient-reported exercise habits: walks around park for 2 hours everyday   O:  POCT: ***  Glucometer readings   Lab Results  Component Value Date   HGBA1C 6.9 (A) 06/24/2020   There were no vitals filed for this visit.  Lipid Panel     Component Value Date/Time   CHOL 193 03/26/2020 1014   TRIG 143 03/26/2020 1014   HDL 37 (L) 03/26/2020 1014   CHOLHDL 5.2 (H)  03/26/2020 1014   LDLCALC 130 (H) 03/26/2020 1014   Clinical Atherosclerotic Cardiovascular Disease (ASCVD): No  The 10-year ASCVD risk score Denman George DC Jr., et al., 2013) is: 12.6%   Values used to calculate the score:     Age: 45 years     Sex: Male     Is Non-Hispanic African American: No     Diabetic: Yes     Tobacco smoker: Yes     Systolic Blood Pressure: 124 mmHg     Is BP treated: Yes     HDL Cholesterol: 37 mg/dL     Total Cholesterol: 193 mg/dL    A/P: Diabetes currently uncontrolled ***. Medication adherence appears optimal.  *** -Continued Metformin XR 500 mg daily -Extensively discussed pathophysiology of diabetes, recommended lifestyle interventions, dietary effects on blood sugar control -Counseled on s/sx of and management of hypoglycemia -Next A1C anticipated ***.   ASCVD risk - primary prevention in patient with diabetes. Last LDL of 130 mg/dL is not controlled. ASCVD risk score is not >20%  - moderate or high intensity statin indicated. -Continued Rosuvastatin 20 mg daily  Written patient instructions provided. Total time in face to face counseling *** minutes.   Follow up PCP Clinic Visit in ***.

## 2021-04-08 ENCOUNTER — Ambulatory Visit: Payer: Medicaid Other | Admitting: Pharmacist

## 2021-05-07 ENCOUNTER — Other Ambulatory Visit: Payer: Self-pay

## 2021-05-07 MED FILL — Rosuvastatin Calcium Tab 20 MG: ORAL | 30 days supply | Qty: 30 | Fill #1 | Status: AC

## 2021-05-28 ENCOUNTER — Encounter (INDEPENDENT_AMBULATORY_CARE_PROVIDER_SITE_OTHER): Payer: Self-pay | Admitting: Primary Care

## 2021-05-28 ENCOUNTER — Other Ambulatory Visit: Payer: Self-pay

## 2021-05-28 ENCOUNTER — Ambulatory Visit (INDEPENDENT_AMBULATORY_CARE_PROVIDER_SITE_OTHER): Payer: Self-pay | Admitting: Primary Care

## 2021-05-28 VITALS — BP 119/80 | HR 85 | Temp 98.0°F | Ht 69.0 in | Wt 208.8 lb

## 2021-05-28 DIAGNOSIS — E119 Type 2 diabetes mellitus without complications: Secondary | ICD-10-CM

## 2021-05-28 DIAGNOSIS — Z760 Encounter for issue of repeat prescription: Secondary | ICD-10-CM

## 2021-05-28 DIAGNOSIS — IMO0002 Reserved for concepts with insufficient information to code with codable children: Secondary | ICD-10-CM

## 2021-05-28 DIAGNOSIS — E782 Mixed hyperlipidemia: Secondary | ICD-10-CM

## 2021-05-28 DIAGNOSIS — E1165 Type 2 diabetes mellitus with hyperglycemia: Secondary | ICD-10-CM

## 2021-05-28 DIAGNOSIS — R7303 Prediabetes: Secondary | ICD-10-CM

## 2021-05-28 DIAGNOSIS — Z6830 Body mass index (BMI) 30.0-30.9, adult: Secondary | ICD-10-CM

## 2021-05-28 DIAGNOSIS — Z720 Tobacco use: Secondary | ICD-10-CM

## 2021-05-28 DIAGNOSIS — F17210 Nicotine dependence, cigarettes, uncomplicated: Secondary | ICD-10-CM

## 2021-05-28 DIAGNOSIS — E6609 Other obesity due to excess calories: Secondary | ICD-10-CM

## 2021-05-28 DIAGNOSIS — I10 Essential (primary) hypertension: Secondary | ICD-10-CM

## 2021-05-28 LAB — POCT GLYCOSYLATED HEMOGLOBIN (HGB A1C): Hemoglobin A1C: 8.8 % — AB (ref 4.0–5.6)

## 2021-05-28 MED ORDER — TRULICITY 1.5 MG/0.5ML ~~LOC~~ SOAJ
1.5000 mg | SUBCUTANEOUS | 4 refills | Status: DC
  Filled 2021-05-28 – 2021-06-10 (×2): qty 2, 28d supply, fill #0
  Filled 2021-07-07: qty 2, 28d supply, fill #1
  Filled 2021-08-10: qty 2, 28d supply, fill #2
  Filled 2021-09-18: qty 2, 28d supply, fill #0
  Filled 2021-11-03: qty 2, 28d supply, fill #1

## 2021-05-28 MED ORDER — TRULICITY 1.5 MG/0.5ML ~~LOC~~ SOAJ
1.5000 mg | SUBCUTANEOUS | 4 refills | Status: DC
  Filled 2021-05-28: qty 0.5, 7d supply, fill #0

## 2021-05-28 MED ORDER — METFORMIN HCL ER 500 MG PO TB24
ORAL_TABLET | Freq: Every day | ORAL | 1 refills | Status: DC
  Filled 2021-05-28: qty 90, fill #0
  Filled 2021-06-10: qty 90, 90d supply, fill #0

## 2021-05-28 NOTE — Patient Instructions (Signed)
?????? ??????? ???????? ?????? ????? Calorie Counting for Weight Loss ??????? ???????? ?? ????? ??????. ?????? ???? ?????? ??? ??? ???? ?? ??????? ???????? ?? ????? ??? ?????? ???? ????? ????? ???????. ???? ????? ????? ???? ?? ???????? ??? ????? ?????? ???? ??? ?????? ????? ??? ????? ???? ??????? ???????? ???? ?????? ?? ??? ????. ??? ??? ??? ????? ??? ????? ?????? ??? ??? ??????? ????? ???? ???? ?????? ??????? ?????? ??? ?????? ???? ???????. ???? ??????? ??????? ???????? ?????? ??? ??????? ???? ?????? ??????? ??????. ???? ?? ???? ?????? ??????? ???????? ?????? ??????? ?? ??? ??? ????? ??? ????? ?????. ??? ??? ?????? ????? ?????? ??? ??? ??????? ????? ??? ??????? ?? ???? ?????. ???? ???? ??????? ?????? ??????? ?? ?? ????? ????? ??. ??? ?????? ?? ?????? ??????? ????????? ?????? ??? ?????? ?? ???? ??? ??? ??????? ???????? ??????? ?? ?????? ????? ???? ??????? ????? ????????. ????? ?? ?????? ?????? ??????? ?? ????? ??? ??????? ???????? ???? ??????? ?????? ??? ????? ???? ??? ?????? ??? ??? ??????? ???????? ???? ????????. ?????? ????? ????? ????? ???? ????? ??????? ???????? ???? ?? ??? ??? ?????? (0.5-0.9 ???). ????? ??? ????? ??? ????? ????? ??????? ???????? ???? ???? ????? ?????? ?????? 500-750 ????? ???????. ??? ??? ?????? ??? 1200-1500 ??? ????? ?????? ???? ?? ????? ???? ??????? ??? ????? ?????. ??????? ??? 1500-1800 ??? ????? ?????? ???? ?? ?????? ???? ?????? ??? ????? ?????. ?? ???? ????? ??????? ??? ?????? ??????? ????????? ????? ????? ??????? ?????? ?? ?? ??????? ??????? ?????? ??? ??????? ???????? ???? ????? ?????? ????? ??????. ??????? ??? ??? ??????? ???????? ??????? ???? ???????? ?????? ??? ?? ???: ????? ??? ??????? ???????? ???? ????? ????? ?? ???? ???? ?? ??????. ???? ???? ??? ??? ??????? ??? ????? ??????. ??? ???? ?????? ???? ????? ???????. ????? ?????? ??????. ???? ???? ????? ???? ??????? ???? ??????? ???????? ???????? ??. ?? ???? ?? ????? ?????? ?? ??????? ?????? ?????? ??????? ???????? ?? ???  ?????? ????? ???? ???? ???? ????? ????? ??????. ??? ?????? ????? ????????? ?????? ???????? ????????? ???? ?????? ??? ????????? ??????? ???? ??????? ???????? ?? ???? ??????? ????????. ??? ?? ???? ???? ???????? ???????? ??? ???? ??? ????? ????? ?? ??? ??????? ???????? ???? ??????? ??? ???? ???????? ?? ???? ?????? ?????? ???????. ???? ??? ???? ??? ??? ??????? ???????? ???????? ?? ????? ???????. ??? ???? ????? ???? ?? ??? ????? ?? ??????? ????? ??? ??? ??????? ???????? ??????? ?? ????? ??????? ?? ??? ????? ???? ???? ?????. ??? ???? ??????? ?? ????? ??? ???? ????? ?? ????? ??????? ?? ????? ?????? ??? ????? ??????? ??? 90 ????? ???????. ??? ??? ?????? ????? ?????? ?????? ???? ?? ?????? 90 ????? ???????. ??? ??? ?????? ???????? ?????? ???? ?? ?????? 180 ????? ???????. ??? ??? ????? ??????? ??? ?? ??? ???? ????? ????? ?? ??? ?? ????? ?????? ?? ???? ??? ????: ?? ???? ???????. ?? ??? ????? ?????? ???? ????? ??? ??? ?????? ???????? ?????? ?? ???????? ??? ???????. ?????? ???? ????????. ????? ???? ??? ?????? ?? ?????? ?? ??? ???????. ??? ??????? ???????? ???????? ?? ?? ???? ??????. ?????? ??? ??????? ???????? ?? ??????? ???? ???????. ????? ?????? ??????? ?? ?????? ???? ???? ???????? ????? ????? ????? ?? ???? ?? ??????? ??????? ?? ?????? ?????????? ??? ?????? ????????. ???? ??????? ???? ???? ??????? ???????? ?? ????? ??? ??????? ???????? ???????? ?? ???? ????? ?????? ??? ????. ?? ??? ??????? ????? ?????? ?? ????? ??????? ???? ??? ??????? ???????? ???????? ?? ?? ??? ?? ???????. ???????? ??? ??? ????? ??? ?????? ???? ????? ??????? ?? ?? ????. ?????? ??? ?????? ????? ????? ??????. ????? ???? ????? ???? ??? ????? ?????? ??? ????? ???????. ??? ???? ??????? ?????? ?? ????? ????? ??? ??? ?????????. ??? ??? ????? ???? ??? ????????? ???????? ?? ??????? ??????????? ???????? ??????? ?? ????? ??? ???? ????? ??????? ?? ?? ????. ???? ??? ???? ??????? ?? ????? ?????? ???????? ?? ?????. ???? ????? ???? ????? ???? ??????? ????  ???????? ????? ?? ????? ???? ???? ??? ?????. ?? ?????? ???? ???? ?? ??????? ?? ???? ?? ??? ????? ????? ?????? ???????? ???. ?????? ??????? ???????? ????????? ???? ??? ???? ????? ??? ??? ?????? ?????? ??????. ???? ???????? ??? ??? ?? ????? ?????. ?? ???? ????? ???? ????? ????????? ?? ????? ?????? ?????????. ???? ??? ???? ??????? ????? ??? ????? ?????? ??????? ??????. ???????? ??? ??? ???????? ??? ????? ??????. ???? ????? ???? ???????? ??? ????? ?????? ???? ?????? ???????. ?? ??????? ??????? ?????? ??? ?????? ????? ?????? ????????? ???????? ???? ?? ??? ??????? ???????? ?? ?????? ???????. ?? ???? ??? ????? ???? ??? ????? ??? ??????. ???? ?? ???? ??????? ????????. ???? ?????? ??????? ?????? ????????? ???????? ???????????? ???? ?? ????? ??? ??? ?????? ?? ?????? ??????? ??????? ???????? ?????????. ?????? ???? ???????? ???????? ?? ?? ???? ?? ????? ?????. ???????? ??? ??? ?????? ??????? ?????? ???????. ????? ??? ???????? ???????? ??? ??????? "????? ??????" ?? "??????? ?? ??????". ?????? ?? ??? ??????? ????? ??????? ??? ???? ??????? ???????? ????? ?? ???? ??????? ???????? ????? ?????? ?? ?????? ????. ????? ??? ??? ????? ?????? ?? ????? ??? ??? ???? ?? ??? ?? ??? ?????? ????? ???? ????? ??? ????? ??????. ???? ????? ???????? ??????? ????? ??? ??????? ?????? ??????? ???????? ?????? ???. ????? ???? ??? ????? ?????? ?????? ???? ????. ?????? ????? ??????? ????? ?? ?????. ??????? ?????? ????? ????? ?????. ????? ??????? ????? ????? ???? ?? ??????? ??????????. ???? ?? ???? ??? ???? ???????? ??????????. ??? ??? ?????? ?????????? ????? ??????? ??? ?????? ?????? ?????? ????????. ??? ?????? ????????? ???? ???? ?? ???:  150 ????? ?? ???????? ?????? ????? ??? ?????. 75 ????? ?? ???????? ?????? ??? ?????. ??????? ???? ???? ??? ??????? ???????? ???????? ?? ????????? ???? ???? ???????. ??????? ??? ?? ???? ??????? ???????? ????? ????. ???? ????? ?????? ?? ??????? ??????? ????????. ?? ??????. ???? ????? ??????? ?? ?????  ?????? ??? ?? ???? ????? ????? ??????? ???????? ???. ?? ??????? ???? ??? ?? ?????????  ????? ??????? ???????. ?? ?????? ????? ??????? ???????? ??????? ???????? ????????? ??? ???? ?? ?????????? ????? ?? ????? ???? ?? ????? ???????? ??? ??? ??? ?? ????? ???????. ???? ??? ??????? ???????? ?? ????????? ?????????? ???? ????? ??????? ??? ???? ?????? ????? ???? ????. ??? ????? ????????? ???? ????? ?????? ???????? ????? ???????? ?????????? ??????????? ????? ?????? ???????? ???? ????? ??? ?????? ?? ??????? ??? ?????? ???????. ?????????? ???? ????? ??? ?????? ?? ???????? ?? ???? ????? ??? ???? ?? 5 ?? ?? ??????? ?? ?? ????. ????? ??? ??? ??????? ???????? ???????? ?? ?????????. ????? ?????????? ??? ??????? ?? ????????? ????? ??????? ????????. ???? ?? ???? ??????? ??????? ????? ??????? ??????? ?? ????????? ?????????? ???? ????? ???????. ??? ????? ??????? ??????? ?????? ?????? ?? ?????????. ?? ??????? ???? ??? ?? ???? ????? ??? ????? ??????? ?? ????????? ???? ?? ????? ??? ????? ???? ?? ??????????? ?? ??????? ?? ?????????? ?? ???? ????? ??? ???? ????? ?? ?????? ??? ??????. ????? ??? ???: ????????. ????? ???????? ??????. ??????? ??????? ???????? ?????? ?? ??????? ??????? ?????????? ????????? ???????. ???? ?? ???? ??????? ??????? ????? ??????? ??????? ?? ????????? ?????????? ???? ????? ???? ??????. ??? ????? ??????? ??????? ?????? ?????? ?? ?????????. ??? ?????? ?????? ??????? ???????? ??? ????? ?????? ???? ??????? ????? ??? ????? ???????. ??? ?????? ??????? ?????? ?? ???? ???? ????? ??? ????? ?????? ???????. ??? ??? ??????? ????? ??????? ???: ??? ?? ????? ???? ?? ??? ??? ????? ?? ????? ?????? ????. ??? ???? ???? ?? ????? ????? ????? ????? ???. ??? ????? ?? ????? ???????? ???? ????? ??? ??? ????? ???. ??? ???? ??????? ??? ??????? ??????? ???? ?? ??? ????? ?????? ????? ?? ????? ???????. ????? ??? ?????? ????? ??????????. ?????? ????? ??? ?????? ??? ????? ?? ?????? ???? ?? ?????? ??? ????? ????? ?????? ???. ???  ???? ??????? ???????? ?????? ?? ???????? ????? ??????? ????? ?? ??????? ????????. ???? ??????? ???? ????? ??? ??????? ?????? ????? ????? ??????? ????? ????? ????? ????????? ????? ??????. ???? ??????? ???????? ?? ??????? ?? ??????? ????????. ???? ??????? ??????? ?????? ?? ??????? ??????? ?? ??????? ?? ???? ????? ?? ???? ?????. ???? ?? ???? ??????? ???????? ?????? ??? ??? ??? ????? ????? ??? ???. ???? ????? ?? ?????? ???? ?????? ?? ????? ?????? ??? ?????? ?? ????????? ?????? ??? ???????? ??? ?????? ??????. ??? ??? ??? ????? ??????? ???????? ????? ????? ??? ?? ?????? ???????? ??? ??? ?? ??????? ?? ?????? ???????. ???? ????? ????????? ???????? ?????????? ??? ???? ????????. ???? ??????? ????? ???? ???????? ????????? ??? ????? ???? ????? ?????? ???? ????????. ??? ???? ?? ????? ????? ????? ???? ????? ????? ?????? ?????. ????? ???????? ??? ??? ??????? ?????? ?? ????? ?? ??????? ???????. ???? ????? ???????? ??? ???? ?????? ?? ???? ??????? ??? ??????? ????? ?? ???????? ??????? ???????. ???? ??? ????? ??????? ?? ?? ???????. ??? ??? ????? ????? ????? ?? ???? ??????? ??? ????? ???? ?????? ???? ???????? ?? ???????. ????? ?????? ??? ?????? ?? ?????????: ????? ?????? ??????? ???????? (Centers for Disease Control and Prevention):? [email or web address] ????? ??????? (Department of Agriculture)?: [email or web address] ???? ???? ??????? ??????? ???????? ?????? ??? ??????? ???? ?????? ??????? ??????. ??? ??? ?????? ????? ?????? ??? ??? ??????? ????? ??? ??????? ?? ???? ?????. ??????? ????? ????? ????? ???? ??? ?????? ???????? ???? ??  1 -2 ??? (0.5-0.9 ???). ???? ??? ????? ????? ????? ??????? ???????? ??????? ???? ?????? 500-750 ????? ???????. ???? ?????? ??? ????????? ??????? ???? ??????? ???????? ?? ???? ??????? ????????. ??? ?? ???? ???? ???????? ???????? ??? ???? ??? ????? ????? ?? ??? ??????? ???????? ???? ??????? ??? ???? ???????? ?? ???? ?????? ?????? ???????. ?????? ??????? ???????? ????????? ???? ??? ???? ????? ??? ??? ?????? ??????  ??????. ???? ??? ??????? ???????? ?? ????????? ?????????? ???? ????? ??????? ??? ???? ??????? ??? ??????? ???? ????. ??? ????? ?? ??? ????????? ?? ???? ?????? ????????? ???? ?????? ???? ??????? ??????. ???? ?? ?????? ??? ????? ???? ?? ???? ?? ???? ??????? ??????.? Document Revised: 12/17/2019 Document Reviewed: 12/17/2019 Elsevier Patient Education  2022 [employer].

## 2021-05-28 NOTE — Progress Notes (Signed)
Subjective:  Patient ID: Frederick Cervantes, male    DOB: [birth year]  Age: 45 y.o. MRN: [id number]  CC: DM/HTN    HPI Mr. Karman Veney is a 45 year old obese male from [place] speaks [place] well. He presents for follow-up of diabetes. Patient does not check blood sugar at home. He voices concerns that his teeth care decayed and he is having all his teeth remove and replace with dentures- His plans is to have this done in [place] because the price is cheaper.   Compliant with meds - Yes Checking CBGs? No  Fasting avg -   Postprandial average -  Exercising regularly? - Yes Watching carbohydrate intake? - Yes does eat bread daily  Neuropathy ? - No Hypoglycemic events - No  - Recovers with :   Pertinent ROS:  Polyuria - No Polydipsia - No Vision problems - Yes Hypertension -Denies shortness of breath, headaches, chest pain or lower extremity edema  Medications as noted below. Taking them regularly without complication/adverse reaction being reported today.   History Dillard has a past medical history of Diabetes mellitus without complication ([place]), Hypertension, and Ulcer, stomach peptic.   He has a past surgical history that includes No past surgeries.   His family history is not on file.He reports that he has been smoking cigarettes. He has been smoking an average of 1 pack per day. He has never used smokeless tobacco. He reports that he does not drink alcohol and does not use drugs.  Current Outpatient Medications on File Prior to Visit  Medication Sig Dispense Refill   aspirin EC 81 MG tablet Take 81 mg by mouth daily as needed ("for hypertension").     Blood Glucose Monitoring Suppl (TRUE METRIX METER) w/Device KIT Use to check blood sugar once daily. 1 kit 0   glucose blood (TRUE METRIX BLOOD GLUCOSE TEST) test strip Use to check blood sugar once daily. 100 each 2   naproxen (NAPROSYN) 500 MG tablet Take 1 tablet (500 mg total) by mouth 2 (two) times daily with a meal. 30  tablet 0   omeprazole (PRILOSEC) 20 MG capsule Take 1 capsule (20 mg total) by mouth daily. 30 capsule 0   TRUEplus Lancets 28G MISC Use to check blood sugar once daily. 100 each 2   acetaminophen (TYLENOL) 500 MG tablet Take 500-1,000 mg by mouth every 6 (six) hours as needed (for headaches).  (Patient not taking: No sig reported)     lisinopril (ZESTRIL) 2.5 MG tablet TAKE 1 TABLET (2.5 MG TOTAL) BY MOUTH DAILY. (Patient not taking: No sig reported) 90 tablet 1   No current facility-administered medications on file prior to visit.    ROS Review of Systems  Constitutional:  Positive for appetite change.       Decreased since being on Trulicity   HENT:         Poor dental carries   All other systems reviewed and are negative.  Objective:  BP 119/80 (BP Location: Left Arm, Patient Position: Sitting, Cuff Size: Normal)   Pulse 85   Temp 98 F (36.7 C) (Oral)   Ht _0  (1.753 m)   Wt 208 lb 12.8 oz (94.7 kg)   SpO2 95%   BMI 30.83 kg/m   BP Readings from Last 3 Encounters:  05/28/21 119/80  02/25/21 124/74  06/24/20 128/78    Wt Readings from Last 3 Encounters:  05/28/21 208 lb 12.8 oz (94.7 kg)  06/24/20 188 lb 9.6  oz (85.5 kg)  03/24/20 194 lb (88 kg)    Physical Exam Vitals reviewed.  Constitutional:      Appearance: Normal appearance. He is obese.  HENT:     Head: Normocephalic.     Right Ear: Tympanic membrane and external ear normal.     Left Ear: Tympanic membrane and external ear normal.     Nose: Nose normal.  Eyes:     Extraocular Movements: Extraocular movements intact.     Pupils: Pupils are equal, round, and reactive to light.  Cardiovascular:     Rate and Rhythm: Normal rate and regular rhythm.     Pulses: Normal pulses.     Heart sounds: Normal heart sounds.  Pulmonary:     Effort: Pulmonary effort is normal.     Breath sounds: Normal breath sounds.  Abdominal:     General: Bowel sounds are normal. There is distension.     Palpations: Abdomen  is soft.  Musculoskeletal:        General: Normal range of motion.     Cervical back: Normal range of motion.  Skin:    General: Skin is warm and dry.  Neurological:     Mental Status: He is alert and oriented to person, place, and time.  Psychiatric:        Mood and Affect: Mood normal.        Behavior: Behavior normal.        Thought Content: Thought content normal.        Judgment: Judgment normal.   Lab Results  Component Value Date   HGBA1C 8.8 (A) 05/28/2021   HGBA1C 6.9 (A) 06/24/2020   HGBA1C 14.6 (A) 03/24/2020    Lab Results  Component Value Date   WBC 9.1 05/28/2021   HGB 18.3 (H) 05/28/2021   HCT 51.6 (H) 05/28/2021   PLT 265 05/28/2021   GLUCOSE 104 (H) 05/28/2021   CHOL 126 05/28/2021   TRIG 171 (H) 05/28/2021   HDL 31 (L) 05/28/2021   LDLCALC 66 05/28/2021   ALT 20 05/28/2021   AST 14 05/28/2021   NA 140 05/28/2021   K 4.3 05/28/2021   CL 105 05/28/2021   CREATININE 0.74 (L) 05/28/2021   BUN 12 05/28/2021   CO2 18 (L) 05/28/2021   INR 1.04 11/25/2017   HGBA1C 8.8 (A) 05/28/2021     Assessment & Plan:  Body mass index is 30.83 kg/m. Danni was seen today for diabetes.  Diagnoses and all orders for this visit:  Mixed hyperlipidemia  Healthy lifestyle diet of fruits vegetables fish nuts whole grains and low saturated fat . Foods high in cholesterol or liver, fatty meats,cheese, butter avocados, nuts and seeds, chocolate and fried foods. -     Lipid Panel  Medication refill -     metFORMIN (GLUCOPHAGE-XR) 500 MG 24 hr tablet; TAKE 1 TABLET (500 MG TOTAL) BY MOUTH DAILY WITH BREAKFAST.  Essential hypertension Blood pressure well controlled on lisinopril 2.5 for renal protection. -     CMP14+EGFR  Class 1 obesity due to excess calories without serious comorbidity with body mass index (BMI) of 30.0 to 30.9 in adult Obesity is 30-39 indicating an excess in caloric intake or underlining conditions. This may lead to other co-morbidities. Lifestyle  modifications of diet and exercise may reduce obesity.  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  Tobacco abuse - I have recommended complete cessation of tobacco use. I have  discussed various options available for assistance with tobacco cessation including over the counter methods (Nicotine gum, patch and lozenges). We also discussed prescription options (Chantix, Nicotine Inhaler / Nasal Spray). The patient is not interested in pursuing any prescription tobacco cessation options at this time. - Patient declines at this time.  .  Diabetes mellitus type 2, uncontrolled, with complications ([place]) Patient is working on his dissertation and admits to not eating healthy and snacking on chips, snacks.  A1c is 8.8-11 months ago he was 6.9.  He will not be able to have dental work done until A1c is below 7.  Increased Trulicity to 1.5 weekly.  Recommended to follow-up with clinical pharmacist in 4 weeks.  Needs to monitor foods that are high in carbohydrates are the following rice, potatoes, breads, sugars, and pastas.  Reduction in the intake (eating) will assist in lowering your blood sugars.  -     CBC with Differential -     HgB A1c -     Discontinue: Dulaglutide (TRULICITY) 1.5 YT/2.4MQ SOPN; Inject 1.5 mg into the skin once a week. -     metFORMIN (GLUCOPHAGE-XR) 500 MG 24 hr tablet; TAKE 1 TABLET (500 MG TOTAL) BY MOUTH DAILY WITH BREAKFAST. -     Dulaglutide (TRULICITY) 1.5 KM/6.3OT SOPN; Inject 1.5 mg into the skin once a week.    I am having Shanda Bumps. Al Tarawneh maintain his aspirin EC, acetaminophen, omeprazole, lisinopril, True Metrix Meter, True Metrix Blood Glucose Test, TRUEplus Lancets 28G, naproxen, metFORMIN, and Trulicity.  Meds ordered this encounter  Medications   DISCONTD: Dulaglutide (TRULICITY) 1.5 RR/1.1AF SOPN    Sig: Inject 1.5 mg into the skin once a week.    Dispense:  0.5 mL    Refill:  4   metFORMIN (GLUCOPHAGE-XR) 500  MG 24 hr tablet    Sig: TAKE 1 TABLET (500 MG TOTAL) BY MOUTH DAILY WITH BREAKFAST.    Dispense:  90 tablet    Refill:  1   Dulaglutide (TRULICITY) 1.5 BX/0.3YB SOPN    Sig: Inject 1.5 mg into the skin once a week.    Dispense:  2 mL    Refill:  4      Follow-up:   Return in about 3 months (around 08/27/2021) for [place] 4 weeks PCP 3.  The above assessment and management plan was discussed with the patient. The patient verbalized understanding of and has agreed to the management plan. Patient is aware to call the clinic if symptoms fail to improve or worsen. Patient is aware when to return to the clinic for a follow-up visit. Patient educated on when it is appropriate to go to the emergency department.   Juluis Mire, NP-C

## 2021-05-29 LAB — CBC WITH DIFFERENTIAL/PLATELET
Basophils Absolute: 0.1 10*3/uL (ref 0.0–0.2)
Basos: 1 %
EOS (ABSOLUTE): 0.2 10*3/uL (ref 0.0–0.4)
Eos: 2 %
Hematocrit: 51.6 % — ABNORMAL HIGH (ref 37.5–51.0)
Hemoglobin: 18.3 g/dL — ABNORMAL HIGH (ref 13.0–17.7)
Immature Grans (Abs): 0 10*3/uL (ref 0.0–0.1)
Immature Granulocytes: 0 %
Lymphocytes Absolute: 3 10*3/uL (ref 0.7–3.1)
Lymphs: 33 %
MCH: 30.9 pg (ref 26.6–33.0)
MCHC: 35.5 g/dL (ref 31.5–35.7)
MCV: 87 fL (ref 79–97)
Monocytes Absolute: 0.7 10*3/uL (ref 0.1–0.9)
Monocytes: 7 %
Neutrophils Absolute: 5.2 10*3/uL (ref 1.4–7.0)
Neutrophils: 57 %
Platelets: 265 10*3/uL (ref 150–450)
RBC: 5.92 x10E6/uL — ABNORMAL HIGH (ref 4.14–5.80)
RDW: 12.6 % (ref 11.6–15.4)
WBC: 9.1 10*3/uL (ref 3.4–10.8)

## 2021-05-29 LAB — CMP14+EGFR
ALT: 20 IU/L (ref 0–44)
AST: 14 IU/L (ref 0–40)
Albumin/Globulin Ratio: 1.8 (ref 1.2–2.2)
Albumin: 4.6 g/dL (ref 4.0–5.0)
Alkaline Phosphatase: 101 IU/L (ref 44–121)
BUN/Creatinine Ratio: 16 (ref 9–20)
BUN: 12 mg/dL (ref 6–24)
Bilirubin Total: <0.2 mg/dL (ref 0.0–1.2)
CO2: 18 mmol/L — ABNORMAL LOW (ref 20–29)
Calcium: 9.7 mg/dL (ref 8.7–10.2)
Chloride: 105 mmol/L (ref 96–106)
Creatinine, Ser: 0.74 mg/dL — ABNORMAL LOW (ref 0.76–1.27)
Globulin, Total: 2.6 g/dL (ref 1.5–4.5)
Glucose: 104 mg/dL — ABNORMAL HIGH (ref 65–99)
Potassium: 4.3 mmol/L (ref 3.5–5.2)
Sodium: 140 mmol/L (ref 134–144)
Total Protein: 7.2 g/dL (ref 6.0–8.5)
eGFR: 116 mL/min/{1.73_m2} (ref >59)

## 2021-05-29 LAB — LIPID PANEL
Chol/HDL Ratio: 4.1 ratio (ref 0.0–5.0)
Cholesterol, Total: 126 mg/dL (ref 100–199)
HDL: 31 mg/dL — ABNORMAL LOW (ref >39)
LDL Chol Calc (NIH): 66 mg/dL (ref 0–99)
Triglycerides: 171 mg/dL — ABNORMAL HIGH (ref 0–149)
VLDL Cholesterol Cal: 29 mg/dL (ref 5–40)

## 2021-05-31 ENCOUNTER — Other Ambulatory Visit (INDEPENDENT_AMBULATORY_CARE_PROVIDER_SITE_OTHER): Payer: Self-pay | Admitting: Primary Care

## 2021-05-31 DIAGNOSIS — Z760 Encounter for issue of repeat prescription: Secondary | ICD-10-CM

## 2021-05-31 DIAGNOSIS — E782 Mixed hyperlipidemia: Secondary | ICD-10-CM

## 2021-05-31 MED ORDER — ROSUVASTATIN CALCIUM 20 MG PO TABS
ORAL_TABLET | Freq: Every day | ORAL | 1 refills | Status: DC
  Filled 2021-05-31 – 2021-06-25 (×2): qty 30, 30d supply, fill #0
  Filled 2021-08-10: qty 30, 30d supply, fill #1
  Filled 2021-11-03: qty 30, 30d supply, fill #0

## 2021-06-01 ENCOUNTER — Other Ambulatory Visit: Payer: Self-pay

## 2021-06-02 ENCOUNTER — Telehealth (INDEPENDENT_AMBULATORY_CARE_PROVIDER_SITE_OTHER): Payer: Self-pay

## 2021-06-02 NOTE — Telephone Encounter (Signed)
After verifying speaking with patient. Informed that cholesterol remains elevated. Rosuvastatin refilled. Verbalized understanding. Maryjean Morn, CMA

## 2021-06-02 NOTE — Telephone Encounter (Signed)
-----   Message from Grayce Sessions, NP sent at 05/31/2021  6:49 PM EDT ----- Your cholesterol is still high, but continued recommendations to make lifestyle changes. Over time and in combination with inflammation and other factors, this contributes to plaque which in turn may lead to stroke and/or heart attack down the road. Other times, there is room for improvement in one's diet and eating healthier can bring this number down and potentially reduce one's risk of heart attack and/or stroke.  renewed rosuvastatin 20mg

## 2021-06-04 ENCOUNTER — Other Ambulatory Visit: Payer: Self-pay

## 2021-06-08 ENCOUNTER — Other Ambulatory Visit: Payer: Self-pay

## 2021-06-10 ENCOUNTER — Other Ambulatory Visit: Payer: Self-pay

## 2021-06-25 ENCOUNTER — Other Ambulatory Visit: Payer: Self-pay

## 2021-06-25 ENCOUNTER — Ambulatory Visit: Payer: Medicaid Other | Attending: Primary Care | Admitting: Pharmacist

## 2021-06-25 DIAGNOSIS — E119 Type 2 diabetes mellitus without complications: Secondary | ICD-10-CM

## 2021-06-25 MED ORDER — OMEPRAZOLE 20 MG PO CPDR
20.0000 mg | DELAYED_RELEASE_CAPSULE | Freq: Every day | ORAL | 2 refills | Status: DC
  Filled 2021-06-25: qty 30, 30d supply, fill #0

## 2021-06-25 MED ORDER — METFORMIN HCL ER 500 MG PO TB24
1000.0000 mg | ORAL_TABLET | Freq: Every day | ORAL | 2 refills | Status: DC
  Filled 2021-06-25: qty 60, 30d supply, fill #0

## 2021-06-25 NOTE — Progress Notes (Signed)
    S:    PCP: [name]  Patient arrives in good spirits.  Presents for diabetes evaluation, education, and management Patient was referred and last seen by Primary Care Provider on 05/28/2021. Per PCP visit, A1c was collected and resulted 8.8%. Patient reported increase in blood sugars due to graduate school stress and eating unhealthy. He also expresses concern about dental work and plans to travel to [place] for replacement. Patient was last seen by pharmacy on 05/28/2020.  Patient reports Diabetes was diagnosed in 03/2020.   Family/Social History:  Family Hx: none Social Hx: current tobacco use, denies smokeless tobacco use  Insurance coverage/medication affordability: self pay  Medication adherence optimal. Patient reports no missed doses for Trulicity though reports occasionally missing Metformin once weekly.  Current diabetes medications include: Trulicity 1.5 mg weekly, Metformin XR 500 mg daily Current hypertension medications include: Lisinopril 2.5 mg daily Current hyperlipidemia medications include: Rosuvastatin 20 mg daily  Patient denies hypoglycemic events. Patient is able to verbalize hypoglycemic plan.   Patient reported dietary habits: Eats 3 meals/day. When asked to verbalize what he eats throughout the day, he states "regular food". Patient denies any excessive sugar intake.   Patient-reported exercise habits: none, reports exercising 2 months prior which helped to decrease blood sugar levels.   Patient denies nocturia (nighttime urination).  Patient denies neuropathy (nerve pain). Patient denies visual changes. Patient denies self foot exams.    O:  Lab Results  Component Value Date   HGBA1C 8.8 (A) 05/28/2021   Lipid Panel     Component Value Date/Time   CHOL 126 05/28/2021 1146   TRIG 171 (H) 05/28/2021 1146   HDL 31 (L) 05/28/2021 1146   CHOLHDL 4.1 05/28/2021 1146   LDLCALC 66 05/28/2021 1146   Home fasting blood sugars: No meter today,  denies daily at home readings. He reports last at home reading was 185 approximately 5 days ago    Clinical Atherosclerotic Cardiovascular Disease (ASCVD): No  The ASCVD Risk score (Arnett DK, et al., 2019) failed to calculate for the following reasons:   The valid total cholesterol range is 130 to 320 mg/dL   A/P: Diabetes Goals: To have a FBG 80-130 mg/dL, 2-H PBG < 017 mg/dL, and P1W < 7%.  Diabetes longstanding currently uncontrolled. Patient is able to verbalize appropriate hypoglycemia management plan. Medication adherence appears optimal. Patient reports no missed doses for Trulicity though reports occasionally missing Metformin once weekly. Patient denies daily at home blood sugar monitoring, and denies lifestyle modifications due to graduate school. Today, patient remains concerned about dental work and A1c control. - Increased Metformin XR 500 mg to Metformin XR 1000 mg daily - Continue Trulicity 1.5 mg weekly - Counseled patient on medication adherence for better A1c control for dental work - Counseled patient to check blood sugars daily and bring meter to next visit - Counseled patient on diabetic diet and physical activity - Counseled on s/sx of and management of hypoglycemia - Extensively discussed pathophysiology of diabetes, recommended lifestyle interventions, dietary effects on blood sugar control - Next A1C anticipated 08/2021.   Follow up on 07/23/2021 with Pharmacy Follow up with Ms. Marcelino Duster on 08/27/2021  Written patient instructions provided.  Total time in face to face counseling 30 minutes.    Patient seen by:  Johnston Ebbs, Student Pharmacist [employer] of 2023  Butch Penny, PharmD, [place], CPP Clinical Pharmacist [facility] [phone]

## 2021-07-07 ENCOUNTER — Other Ambulatory Visit: Payer: Self-pay

## 2021-07-09 ENCOUNTER — Other Ambulatory Visit: Payer: Self-pay

## 2021-07-23 ENCOUNTER — Ambulatory Visit: Payer: Self-pay | Attending: Primary Care | Admitting: Pharmacist

## 2021-07-23 ENCOUNTER — Other Ambulatory Visit: Payer: Self-pay

## 2021-07-23 DIAGNOSIS — E119 Type 2 diabetes mellitus without complications: Secondary | ICD-10-CM

## 2021-07-23 NOTE — Progress Notes (Signed)
    S:      Patient arrives in good spirits.  Presents for diabetes evaluation, education, and management. Patient was referred and last seen by Primary Care Provider on 05/28/2021. Patient last seen by pharmacy on 06/25/2021 where metformin was increased, however patient reports today only taking one tablet since then due to fear of hypoglycemia. Patient is trying to return to [place] for a dental procedure, however they will not operate unless A1c is <7.  Family/Social History:  -Current smoker  [employer] affordability: [place] Medicaid  Medication adherence reported, except metformin is not being taken as prescribed. Patient reports that the last time he took metformin 1000 mg, he experienced hypoglycemia while driving and is fearful to take 1000 mg again. Explained the importance of consistent diet to the patient in avoiding hypoglycemia. Patient reports only eating lunch, with no breakfast or dinner.   Current diabetes medications include:  -Metformin 1000 XR daily> patient taking 500 mg daily -Trulicity 1.5 mg weekly  Current hypertension medications include:  -Lisinopril 2.5 mg daily  Current hyperlipidemia medications include:  -Rosuvastatin 20 mg daily  Patient denies recent hypoglycemic events.  Patient reported dietary habits: Eats 1 meals/day Breakfast:none Lunch: Eats keto diet, sometimes bread and rice Dinner: None Snacks: Fruit Drinks: Black coffee, water  Patient-reported exercise habits: none   Patient denies nocturia (nighttime urination).  Patient denies neuropathy (nerve pain). Patient denies visual changes. Patient reports self foot exams.     O:  Lab Results  Component Value Date   HGBA1C 8.8 (A) 05/28/2021   There were no vitals filed for this visit.  Lipid Panel     Component Value Date/Time   CHOL 126 05/28/2021 1146   TRIG 171 (H) 05/28/2021 1146   HDL 31 (L) 05/28/2021 1146   CHOLHDL 4.1 05/28/2021 1146   LDLCALC 66  05/28/2021 1146   Glucose: 147  Home fasting blood sugars: 100-110  2 hour post-meal/random blood sugars: 160-165.  Clinical Atherosclerotic Cardiovascular Disease (ASCVD): No  The ASCVD Risk score (Arnett DK, et al., 2019) failed to calculate for the following reasons:   The valid total cholesterol range is 130 to 320 mg/dL    A/P: Diabetes longstanding, currently controlled according to the patient. Patient is able to verbalize appropriate hypoglycemia management plan. Control is suboptimal due to adherence. -Continue Trulicity 1.5 mg weekly -Start taking metformin 1000 mg with lunch as prescribed in conjunction with at least one small meal at breakfast time. If hypoglycemia symptoms occur, patient may reduce dose back to 500 mg and report the change at follow up -Extensively discussed pathophysiology of diabetes, recommended lifestyle interventions, dietary effects on blood sugar control -Counseled on s/sx of and management of hypoglycemia -Next A1C anticipated 08/27/2021.   Written patient instructions provided.  Total time in face to face counseling 32 minutes.  Follow up Pharmacist on 08/11/2021 for follow up. Clinic Visit 08/27/2021 with PCP.     Thank you for allowing pharmacy to participate in this patient's care.  Enos Fling, PharmD PGY1 Pharmacy Resident 07/23/2021 11:06 AM Check [email or web address] for unit specific pharmacy number

## 2021-08-10 ENCOUNTER — Other Ambulatory Visit: Payer: Self-pay

## 2021-08-11 ENCOUNTER — Ambulatory Visit: Payer: Medicaid Other | Admitting: Pharmacist

## 2021-08-25 NOTE — Progress Notes (Unsigned)
° ° °  S:      Patient arrives in good spirits.  Presents for diabetes evaluation, education, and management. Patient was referred and last seen by Primary Care Provider on 05/28/2021. Patient last seen by pharmacy on 07/23/2021 where patient reported not taking metformin as prescribed due to fear of hypoglycemia. Metformin adherence was emphasized at that time with discussion of low risk of hypoglycemia. Patient is trying to return to [place] for a dental procedure, however they will not operate unless A1c is <7.  Today, *** -how taking metformin?  Family/Social History:  -Current smoker  [employer] affordability: [place] Medicaid  Medication adherence reported, except metformin is not being taken as prescribed. Patient reports that the last time he took metformin 1000 mg, he experienced hypoglycemia while driving and is fearful to take 1000 mg again. Explained the importance of consistent diet to the patient in avoiding hypoglycemia. Patient reports only eating lunch, with no breakfast or dinner.   Current diabetes medications include:  -Metformin 1000 XR daily> patient taking 500 mg daily -Trulicity 1.5 mg weekly  Current hypertension medications include:  -Lisinopril 2.5 mg daily  Current hyperlipidemia medications include:  -Rosuvastatin 20 mg daily  Patient denies recent hypoglycemic events.  Patient reported dietary habits: Eats 1 meals/day Breakfast:none Lunch: Eats keto diet, sometimes bread and rice Dinner: None Snacks: Fruit Drinks: Black coffee, water  Patient-reported exercise habits: none   Patient denies nocturia (nighttime urination).  Patient denies neuropathy (nerve pain). Patient denies visual changes. Patient reports self foot exams.     O:  Lab Results  Component Value Date   HGBA1C 8.8 (A) 05/28/2021   There were no vitals filed for this visit.  Lipid Panel     Component Value Date/Time   CHOL 126 05/28/2021 1146   TRIG 171 (H)  05/28/2021 1146   HDL 31 (L) 05/28/2021 1146   CHOLHDL 4.1 05/28/2021 1146   LDLCALC 66 05/28/2021 1146   Glucose: 147  Home fasting blood sugars: 100-110  2 hour post-meal/random blood sugars: 160-165.  Clinical Atherosclerotic Cardiovascular Disease (ASCVD): No  The ASCVD Risk score (Arnett DK, et al., 2019) failed to calculate for the following reasons:   The valid total cholesterol range is 130 to 320 mg/dL    A/P: Diabetes longstanding, currently controlled according to the patient. Patient is able to verbalize appropriate hypoglycemia management plan. Control is suboptimal due to adherence. -Continue Trulicity 1.5 mg weekly -Start taking metformin 1000 mg with lunch as prescribed in conjunction with at least one small meal at breakfast time. If hypoglycemia symptoms occur, patient may reduce dose back to 500 mg and report the change at follow up -Extensively discussed pathophysiology of diabetes, recommended lifestyle interventions, dietary effects on blood sugar control -Counseled on s/sx of and management of hypoglycemia -Next A1C anticipated 08/27/2021.   Written patient instructions provided.  Total time in face to face counseling 32 minutes.  Follow up Pharmacist on 08/11/2021 for follow up. Clinic Visit 08/27/2021 with PCP.     Thank you for allowing pharmacy to participate in this patient's care.  Enos Fling, PharmD PGY1 Pharmacy Resident 08/25/2021 5:49 PM Check [email or web address] for unit specific pharmacy number

## 2021-08-26 ENCOUNTER — Ambulatory Visit: Payer: Medicaid Other | Admitting: Pharmacist

## 2021-08-27 ENCOUNTER — Ambulatory Visit (INDEPENDENT_AMBULATORY_CARE_PROVIDER_SITE_OTHER): Payer: Medicaid Other | Admitting: Primary Care

## 2021-09-10 ENCOUNTER — Other Ambulatory Visit: Payer: Self-pay

## 2021-09-10 ENCOUNTER — Ambulatory Visit (INDEPENDENT_AMBULATORY_CARE_PROVIDER_SITE_OTHER): Payer: Self-pay | Admitting: Primary Care

## 2021-09-10 ENCOUNTER — Encounter (INDEPENDENT_AMBULATORY_CARE_PROVIDER_SITE_OTHER): Payer: Self-pay | Admitting: Primary Care

## 2021-09-10 VITALS — BP 131/82 | HR 88 | Temp 97.5°F | Ht 69.0 in | Wt 215.0 lb

## 2021-09-10 DIAGNOSIS — Z760 Encounter for issue of repeat prescription: Secondary | ICD-10-CM

## 2021-09-10 DIAGNOSIS — F17200 Nicotine dependence, unspecified, uncomplicated: Secondary | ICD-10-CM

## 2021-09-10 DIAGNOSIS — B351 Tinea unguium: Secondary | ICD-10-CM

## 2021-09-10 DIAGNOSIS — E119 Type 2 diabetes mellitus without complications: Secondary | ICD-10-CM

## 2021-09-10 DIAGNOSIS — F17210 Nicotine dependence, cigarettes, uncomplicated: Secondary | ICD-10-CM

## 2021-09-10 DIAGNOSIS — I10 Essential (primary) hypertension: Secondary | ICD-10-CM

## 2021-09-10 LAB — POCT GLYCOSYLATED HEMOGLOBIN (HGB A1C): Hemoglobin A1C: 7.1 % — AB (ref 4.0–5.6)

## 2021-09-10 MED ORDER — METFORMIN HCL ER 500 MG PO TB24
1000.0000 mg | ORAL_TABLET | Freq: Every day | ORAL | 2 refills | Status: DC
  Filled 2021-09-10: qty 60, 30d supply, fill #0

## 2021-09-10 NOTE — Progress Notes (Signed)
° °Subjective:  °Patient ID: Frederick Cervantes, male    DOB: 08/17/1976  Age: 45 y.o. MRN: [id number] ° °CC: Diabetes ° ° °HPI °Frederick Cervantes presents for follow-up of diabetes. Patient does check blood sugar at home. Management of HTN-Denies shortness of breath, headaches, chest pain or lower extremity edema  ° °Compliant with meds - Yes °Checking CBGs? Yes ° Fasting avg - 118-160 ° Postprandial average -  °Exercising regularly? - Yes °Watching carbohydrate intake? - Yes °Neuropathy ? - Yes °Hypoglycemic events - No ° - Recovers with :  ° °Pertinent ROS:  °Polyuria - No °Polydipsia - No °Vision problems - Yes ° °Medications as noted below. Taking them regularly without complication/adverse reaction being reported today.  ° °History °Frederick Cervantes has a past medical history of Diabetes mellitus without complication (HCC), Hypertension, and Ulcer, stomach peptic.  ° °Frederick Cervantes has a past surgical history that includes No past surgeries.  ° °His family history is not on file.Frederick Cervantes reports that Frederick Cervantes has been smoking cigarettes. Frederick Cervantes has been smoking an average of 1 pack per day. Frederick Cervantes has never used smokeless tobacco. Frederick Cervantes reports that Frederick Cervantes does not drink alcohol and does not use drugs. ° °Current Outpatient Medications on File Prior to Visit  °Medication Sig Dispense Refill  ° omeprazole (PRILOSEC) 20 MG capsule Take 1 capsule (20 mg total) by mouth daily. 30 capsule 2  ° rosuvastatin (CRESTOR) 20 MG tablet TAKE 1 TABLET (20 MG TOTAL) BY MOUTH DAILY. 90 tablet 1  ° aspirin EC 81 MG tablet Take 81 mg by mouth daily as needed ("for hypertension"). (Patient not taking: Reported on 09/10/2021)    ° Blood Glucose Monitoring Suppl (TRUE METRIX METER) w/Device KIT Use to check blood sugar once daily. (Patient not taking: Reported on 09/10/2021) 1 kit 0  ° Dulaglutide (TRULICITY) 1.5 MG/0.5ML SOPN Inject 1.5 mg into the skin once a week. (Patient not taking: Reported on 09/10/2021) 2 mL 4  ° glucose blood (TRUE METRIX BLOOD GLUCOSE TEST) test strip Use to  check blood sugar once daily. (Patient not taking: Reported on 09/10/2021) 100 each 2  ° lisinopril (ZESTRIL) 2.5 MG tablet TAKE 1 TABLET (2.5 MG TOTAL) BY MOUTH DAILY. (Patient not taking: Reported on 09/10/2021) 90 tablet 1  ° TRUEplus Lancets 28G MISC Use to check blood sugar once daily. (Patient not taking: Reported on 09/10/2021) 100 each 2  ° °No current facility-administered medications on file prior to visit.  ° ° °ROS °Comprehensive ROS positive and negative noted in HPI ° °Objective:  °BP 131/82 (BP Location: Right Arm, Patient Position: Sitting, Cuff Size: Large)    Pulse 88    Temp (!) 97.5 °F (36.4 °C) (Temporal)    Ht 5' 9" (1.753 m)    Wt 215 lb (97.5 kg)    SpO2 96%    BMI 31.75 kg/m²  ° °BP Readings from Last 3 Encounters:  °09/10/21 131/82  °05/28/21 119/80  °02/25/21 124/74  ° ° °Wt Readings from Last 3 Encounters:  °09/10/21 215 lb (97.5 kg)  °05/28/21 208 lb 12.8 oz (94.7 kg)  °06/24/20 188 lb 9.6 oz (85.5 kg)  ° °Physical exam: °General: Vital signs reviewed.  Patient is well-developed and well-nourished,obese male in no acute distress and cooperative with exam. °Head: Normocephalic and atraumatic. °Eyes: EOMI, conjunctivae normal, no scleral icterus. °Neck: Supple, trachea midline, normal ROM, no JVD, masses, thyromegaly, or carotid bruit present. °Cardiovascular: RRR, S1 normal, S2 normal, no murmurs, gallops, or rubs. °Pulmonary/Chest: Clear   to auscultation bilaterally, no wheezes, rales, or rhonchi. °Abdominal: Soft, non-tender, non-distended, BS +, no masses, organomegaly, or guarding present. °Musculoskeletal: No joint deformities, erythema, or stiffness, ROM full and nontender. °Extremities: No lower extremity edema bilaterally,  pulses symmetric and intact bilaterally. No cyanosis or clubbing. °Neurological: A&O x3, Strength is normal °Skin: Warm, dry and intact. No rashes or erythema. °Psychiatric: Normal mood and affect. speech and behavior is normal. Cognition and memory are normal. °    °Lab Results  °Component Value Date  ° HGBA1C 7.1 (A) 09/10/2021  ° HGBA1C 8.8 (A) 05/28/2021  ° HGBA1C 6.9 (A) 06/24/2020  ° ° °Lab Results  °Component Value Date  ° WBC 9.1 05/28/2021  ° HGB 18.3 (H) 05/28/2021  ° HCT 51.6 (H) 05/28/2021  ° PLT 265 05/28/2021  ° GLUCOSE 104 (H) 05/28/2021  ° CHOL 126 05/28/2021  ° TRIG 171 (H) 05/28/2021  ° HDL 31 (L) 05/28/2021  ° LDLCALC 66 05/28/2021  ° ALT 20 05/28/2021  ° AST 14 05/28/2021  ° NA 140 05/28/2021  ° K 4.3 05/28/2021  ° CL 105 05/28/2021  ° CREATININE 0.74 (L) 05/28/2021  ° BUN 12 05/28/2021  ° CO2 18 (L) 05/28/2021  ° INR 1.04 11/25/2017  ° HGBA1C 7.1 (A) 09/10/2021  ° ° ° °Assessment & Plan:  ° °Frederick Cervantes was seen today for diabetes. ° °Diagnoses and all orders for this visit: ° °Type 2 diabetes mellitus without complication, unspecified whether long term insulin use (HCC) °-     HgB A1c 7.1 Monitor foods that are high in carbohydrates are the following rice, potatoes, breads, sugars, and pastas.  Reduction in the intake (eating) will assist in lowering your blood sugars.  ° °Onychomycosis °-     Ambulatory referral to Podiatry ° °Comprehensive diabetic foot examination, type 2 DM, encounter for (HCC) °Completed  °I have discontinued Frederick Cervantes's acetaminophen and naproxen. I am also having him maintain his aspirin EC, lisinopril, True Metrix Meter, True Metrix Blood Glucose Test, TRUEplus Lancets 28G, Trulicity, rosuvastatin, omeprazole, and metFORMIN. ° °Meds ordered this encounter  °Medications  ° metFORMIN (GLUCOPHAGE-XR) 500 MG 24 hr tablet  °  Sig: Take 2 tablets (1,000 mg total) by mouth daily with breakfast.  °  Dispense:  180 tablet  °  Refill:  2  ° ° ° °Follow-up:  ° °Return in about 3 months (around 12/09/2021) for DDM/Fasting. ° °The above assessment and management plan was discussed with the patient. The patient verbalized understanding of and has agreed to the management plan. Patient is aware to call the clinic if symptoms fail to improve or  worsen. Patient is aware when to return to the clinic for a follow-up visit. Patient educated on when it is appropriate to go to the emergency department.  ° °Michelle Edwards, NP-C ° °  °

## 2021-09-11 ENCOUNTER — Other Ambulatory Visit: Payer: Self-pay

## 2021-09-12 ENCOUNTER — Other Ambulatory Visit: Payer: Self-pay

## 2021-09-18 ENCOUNTER — Other Ambulatory Visit: Payer: Self-pay

## 2021-09-23 ENCOUNTER — Other Ambulatory Visit: Payer: Self-pay

## 2021-11-03 ENCOUNTER — Other Ambulatory Visit: Payer: Self-pay

## 2021-12-09 ENCOUNTER — Encounter (INDEPENDENT_AMBULATORY_CARE_PROVIDER_SITE_OTHER): Payer: Self-pay | Admitting: Primary Care

## 2021-12-09 ENCOUNTER — Other Ambulatory Visit: Payer: Self-pay

## 2021-12-09 ENCOUNTER — Ambulatory Visit (INDEPENDENT_AMBULATORY_CARE_PROVIDER_SITE_OTHER): Payer: Self-pay | Admitting: Primary Care

## 2021-12-09 VITALS — BP 138/87 | HR 79 | Temp 97.9°F | Ht 69.0 in | Wt 219.0 lb

## 2021-12-09 DIAGNOSIS — E119 Type 2 diabetes mellitus without complications: Secondary | ICD-10-CM

## 2021-12-09 DIAGNOSIS — Z760 Encounter for issue of repeat prescription: Secondary | ICD-10-CM

## 2021-12-09 DIAGNOSIS — I10 Essential (primary) hypertension: Secondary | ICD-10-CM

## 2021-12-09 DIAGNOSIS — E1165 Type 2 diabetes mellitus with hyperglycemia: Secondary | ICD-10-CM

## 2021-12-09 DIAGNOSIS — E782 Mixed hyperlipidemia: Secondary | ICD-10-CM

## 2021-12-09 DIAGNOSIS — K2100 Gastro-esophageal reflux disease with esophagitis, without bleeding: Secondary | ICD-10-CM

## 2021-12-09 DIAGNOSIS — Z794 Long term (current) use of insulin: Secondary | ICD-10-CM

## 2021-12-09 LAB — POCT GLYCOSYLATED HEMOGLOBIN (HGB A1C): Hemoglobin A1C: 7.6 % — AB (ref 4.0–5.6)

## 2021-12-09 MED ORDER — METFORMIN HCL ER 500 MG PO TB24
1000.0000 mg | ORAL_TABLET | Freq: Every day | ORAL | 2 refills | Status: DC
  Filled 2021-12-09: qty 60, 30d supply, fill #0

## 2021-12-09 MED ORDER — LISINOPRIL 10 MG PO TABS
10.0000 mg | ORAL_TABLET | Freq: Every day | ORAL | 1 refills | Status: DC
  Filled 2021-12-09: qty 30, 30d supply, fill #0
  Filled 2022-01-21: qty 30, 30d supply, fill #1

## 2021-12-09 MED ORDER — PANTOPRAZOLE SODIUM 40 MG PO TBEC
40.0000 mg | DELAYED_RELEASE_TABLET | Freq: Every day | ORAL | 3 refills | Status: DC
  Filled 2021-12-09: qty 30, 30d supply, fill #0

## 2021-12-09 MED ORDER — TRULICITY 1.5 MG/0.5ML ~~LOC~~ SOAJ
1.5000 mg | SUBCUTANEOUS | 4 refills | Status: DC
  Filled 2021-12-09: qty 2, 28d supply, fill #0
  Filled 2022-01-21: qty 2, 28d supply, fill #1

## 2021-12-09 NOTE — Patient Instructions (Signed)
??????? ?? ????? ????????? ????? ?Smoking Tobacco Information, Adult ?????? ????? ??? ??????. ????? ????? ??? ???? ???? (????)? ??? ???? ???????? ????? ????? ???? ?????????. ????? ????????? ???????. ????? ???? ????????? ???? ??? ???? ?????? ?? ???????. ????? ????? ????? ??? ???? ???????? ???? ???? ???? ?? ???? ???? ????? ?? ??? ??????? ??????? ?? ????? ???????. ???? ???? ?? ???? ????? ????? ????? ?????? ????? ????? ?????? ??????? ??? ???: ? ???????. ????? ????? ???? ????? ??????? ???????? ????????? ??? ??????? ???? ?? ???? ??? ??????? ???????? ?? ????? ???? ?? ?????. ? COPD (????? ?????? ????????? ??????). ???? ???? ????? ????? ????? ???? ?? ????? ??????. ??? ????? ????? ????? ????? ?????. ? ?????? ??? ???? (??? ?????) ?????? ????? ??????? ???????? ?? ?????? ???????. ? ?????? ???????? ??? ???????? ??????. ? ????? ???? ????? (??????????). ???? ??? ????? ???? ????? ??????? ????????. ? ????? ?? ?????? ??????. ?? ???? ???? ?????? ??????? ?GERD (???? ?????? ???? ???????? ?????? ???????). ? ????? ???? ?????? ??? ????? ????? ?????? ???????. ? ????? ???? ?????? ?????. ????? ?? ???? ??????? ??? ????? ?? ???? ?????? ??: ? ????????. ? ????? ?????? ?????? ????? ?? ?????. ????? ?? ???? ????? ????? ????? ??? ????? ??????????? ???? ??????? ???????: ? ???? ??? ????? ?? ?????? ??? ????? ???? ????????? ????? ???? ???? ??????. ??????? ?? ????? ????? ???????? ???????? ???????? ?????? ????? ??????? ????. ? ??????? ???? ??????. ?????? ???? ????? ????? ???? ????????? ??????? ????? ???????? ?????? ???? ?????? ???????. ? ??????? ?????? ???? ???? ??? ?? ????. ??? ??? ??????? ?????? ??????? ?????? ????? ??????? ?????? ???? ?????. ?? ?? ????? ???????? ???? ???? ???????? ???????: ?? SIDS (??????? ??? ????? ???????). ?? ???? ?????. ?? ???? ?????. ??? ??????? ??? ??????? ???? ??????? ???: ? ?????? ????? ???? ????? ?????. ? ?????? ?? ????? ????? ???? ?????? ????? ???? ?????? ?????. ? ????? ?????. ? ????? ??????? ?? ????? ??????? ??????. ???  ????????? ???? ?????? ??????? ??????? ?? ???????? ??????? ???????? ?? ??????? ? ? ????? ??? ????? ?? ???????. ??? ??? ??????? ?? ??????? ??? ??? ???? ??????. ? ??? ??? ??????? ?? ??????? ???????? ???. ????? ?? ??????? ???? ?????? ???????? ???? ??????? ?????? ?? ???????? ????????. ? ??? ????? ??????? ?? ??????? ?????? ??????? ???? ????? ???? ??????? ???.  ? ????? ??????? ?????? ???? ????? ??????? ?? ??????? ??? ???????. ???? ????? ?? ?????? ?????? ?? ??????? ?? ???????. ???????? ?? ??????? ???? ???? ????? ????? ???? ?? ???? ????? ?? ????? ????? ??????. ? ??? ??????? ???? ??????? ?????? ??????? ?? ???? ??????? ????? ????? ????????? ???????? ??? ??????? ?? ??????? ????? ????? ?? ???? ??? ???? ???? ?? ???? ?? ????? ?? ???? ?? ????. ? ????? ??? ?????? ?? ????? ????? ????? ??? ??????? ???????????. ?????? ??????? ??????????? ??? ??????? ?????? ????? ??? ???? ???????? ????. ? ??? ????? ??????? ?? ??????? ?? ??? ????? ??? ????. ????????? ??? ???? ?? ??? ??????? ????? ?? ???? ?????? ????????. ? ???? ?? ???? ???? ????? ???? ???? ?? ??????? ????. ?????? ???? ???? ??? ?????? ???? ?????? ?? ?? ???? ????. ???? ?????? ? ??? ??? ????? ????????? ?????. ? ??? ??? ????? ????? ?? ??????? ???? ??? ???? ???? ??????. ? ??? ??????? ?? ?????? ??????? ?????? ???? ????? ???. ???? ??? ?? ????? ????? ??? ??????? ?? ???????. ? ??? ???????? ?????? ????? ??????. ???????? ?? ??????? ?? ???? ?????? ??? ???? ???? ??? ???????. ? ??? ????? ?? ?????? ?????? ??? ?????????. ??? ????? ??? ?????? ?? ???????? ?? ?????? ??????? ???? ?? ????? ?????? ?? ???? ??? ????? ??? ????????. ? ???? ???? ???? ??????? ?????? ????? ?? ?? ????? ???? ?????? (???????) ????? ?? ???????. ?? ???? ??? ???????? ???? ????????? ??????? ????????? ????. ? ??? ????? ?? ?????? ?????? ??? ?????? ??????? ??? ?????? ?? ??????. ?????? ????? ??????? ??? ???????? ?? ??????? ?? ???????? ??? ???? ???: ? ??? ?????? ?? ????????? ???????? ?? ???? ??????? ??????. ? ???? ???? ????? ?????? ?? ???????  ?? ???????. ? ????? ?? ???????? ??????? ???? ???? ????? ??? ??????? ?? ???????. ? ???????? ??? ?????? ??? ??????? ????? ?????? ?? ??????? ?? ???????? ?? ?????? ??????. ? ??????? ??? ???????? ?????? ????? ???????? ??????? ?? ???????  ActualTaxes.ch: [phone]-Quit-Now [phone]) ?????? ?????? ??? ?????? ?? ????????? ??? ??? ?????? ?? ????????? ??? ??????? ?? ??????? ??? ??????? ???????: ? HelpGuide.org: www.[email or web address].org ? [email or web address]: [email or web address].gov ? [employer] (??????? ???????? ??????? ??????? ???????): [email or web address] ???? ??????? ???????? ??????? ?????? ?? ??????? ???????: ? ?????? ??????? ?? ??????. ? ???? ??? ????? ?? ???? ?? ?????? ??? ????? ??????. ? ???????? ?? ??? ??????. ? ??????? ????? ????? ???. ? ?????? ????? ?? ?????? ???????. ? ?????? ?????? ???? ???? ?? ?????. ????? ? ???? ?? ???? ????? ????? ????? ??? ???? ???? ?? ???? ??????? ??????? ?????? ??????????. ? ????? ??? ????? ?? ???????. ??? ??? ??????? ?? ??????? ??? ??? ???? ??????. ??? ??????? ???? ??????? ?????? ??? ????? ??? ???????? ??????? ?? ???????. ? ??? ?? ???????? ?? ?????? ?????? ??? ?????? ??? ??????? ????? ?????? ?? ??????? ?? ???????. ???? ?????? ?? ??????? ??????? ???? ??????? ??? ??? ??? ??????. ???? ????? ?? ??? ????????? ?? ???? ?????? ????????? ???? ?????? ???? ??????? ??????. ???? ?? ?????? ??? ????? ???? ?? ???? ?? ???? ??????? ??????.? ?Document Revised: 07/23/2020 Document Reviewed: 07/23/2020 ?[employer] Patient Education ? 2022 [employer] ? ?

## 2021-12-09 NOTE — Progress Notes (Signed)
? ?Subjective:  ?Patient ID: Frederick Cervantes, male    DOB: Jun 11, 1976  Age: 46 y.o. MRN: [id number] ? ?CC: Diabetes ? ? ?HPI ?Frederick Cervantes 46 year old Arabic male presents for follow-up of diabetes. Patient does not check blood sugar at home ? ?Compliant with meds - no ?Checking CBGs? No ? Fasting avg -  ? Postprandial average -  ?Exercising regularly? - Yes ?Watching carbohydrate intake? - Yes ?Neuropathy ? - No ?Hypoglycemic events - No ? - Recovers with :  ? ?Pertinent ROS:  ?Polyuria - Yes ?Polydipsia - No ?Vision problems - No ? ?Medications as noted below. Taking them regularly without complication/adverse reaction being reported today.  ? ?History ?Frederick Cervantes has a past medical history of Diabetes mellitus without complication ([place]), Hypertension, and Ulcer, stomach peptic.  ? ?Frederick Cervantes has a past surgical history that includes No past surgeries.  ? ?His family history is not on file.Frederick Cervantes reports that Frederick Cervantes has been smoking cigarettes. Frederick Cervantes has been smoking an average of 1 pack per day. Frederick Cervantes has never used smokeless tobacco. Frederick Cervantes reports that Frederick Cervantes does not drink alcohol and does not use drugs. ? ?Current Outpatient Medications on File Prior to Visit  ?Medication Sig Dispense Refill  ? rosuvastatin (CRESTOR) 20 MG tablet TAKE 1 TABLET (20 MG TOTAL) BY MOUTH DAILY. 90 tablet 1  ? aspirin EC 81 MG tablet Take 81 mg by mouth daily as needed ("for hypertension"). (Patient not taking: Reported on 09/10/2021)    ? Blood Glucose Monitoring Suppl (TRUE METRIX METER) w/Device KIT Use to check blood sugar once daily. (Patient not taking: Reported on 09/10/2021) 1 kit 0  ? glucose blood (TRUE METRIX BLOOD GLUCOSE TEST) test strip Use to check blood sugar once daily. (Patient not taking: Reported on 09/10/2021) 100 each 2  ? TRUEplus Lancets 28G MISC Use to check blood sugar once daily. (Patient not taking: Reported on 09/10/2021) 100 each 2  ? ?No current facility-administered medications on file prior to visit.  ? ? ?ROS ?Comprehensive ROS  Pertinent positive and negative noted in HPI   ? ?Objective:  ?BP 138/87 (BP Location: Right Arm, Patient Position: Sitting, Cuff Size: Normal)   Pulse 79   Temp 97.9 ?F (36.6 ?C) (Oral)   Ht _0  (1.753 m)   Wt 219 lb (99.3 kg)   SpO2 94%   BMI 32.34 kg/m?  ? ?BP Readings from Last 3 Encounters:  ?12/09/21 138/87  ?09/10/21 131/82  ?05/28/21 119/80  ? ? ?Wt Readings from Last 3 Encounters:  ?12/09/21 219 lb (99.3 kg)  ?09/10/21 215 lb (97.5 kg)  ?05/28/21 208 lb 12.8 oz (94.7 kg)  ? ? ?Physical Exam ?Vitals reviewed.  ?Constitutional:   ?   Appearance: Normal appearance.  ?HENT:  ?   Head: Normocephalic.  ?   Right Ear: Tympanic membrane and external ear normal.  ?   Left Ear: Tympanic membrane and external ear normal.  ?   Nose: Nose normal.  ?Cardiovascular:  ?   Rate and Rhythm: Normal rate and regular rhythm.  ?Pulmonary:  ?   Effort: Pulmonary effort is normal.  ?   Breath sounds: Normal breath sounds.  ?Abdominal:  ?   General: Bowel sounds are normal. There is distension.  ?   Palpations: Abdomen is soft.  ?Musculoskeletal:  ?   Cervical back: Normal range of motion.  ?Skin: ?   General: Skin is warm and dry.  ?Neurological:  ?   Mental Status: Frederick Cervantes  is alert and oriented to person, place, and time.  ?Psychiatric:     ?   Mood and Affect: Mood normal.     ?   Behavior: Behavior normal.     ?   Thought Content: Thought content normal.     ?   Judgment: Judgment normal.  ? ?Lab Results  ?Component Value Date  ? HGBA1C 7.6 (A) 12/09/2021  ? HGBA1C 7.1 (A) 09/10/2021  ? HGBA1C 8.8 (A) 05/28/2021  ? ? ?Lab Results  ?Component Value Date  ? WBC 8.2 12/09/2021  ? HGB 17.2 12/09/2021  ? HCT 48.8 12/09/2021  ? PLT 266 12/09/2021  ? GLUCOSE 160 (H) 12/09/2021  ? CHOL 166 12/09/2021  ? TRIG 279 (H) 12/09/2021  ? HDL 37 (L) 12/09/2021  ? [place] 83 12/09/2021  ? ALT 31 12/09/2021  ? AST 14 12/09/2021  ? NA 138 12/09/2021  ? K 4.3 12/09/2021  ? CL 101 12/09/2021  ? CREATININE 0.81 12/09/2021  ? BUN 11 12/09/2021  ?  CO2 21 12/09/2021  ? INR 1.04 11/25/2017  ? HGBA1C 7.6 (A) 12/09/2021  ? ? ? ?Assessment & Plan:  ?Frederick Cervantes was seen today for diabetes. ? ?Diagnoses and all orders for this visit: ? ?Type 2 diabetes mellitus without complication, unspecified whether long term insulin use ([place]) ?Also not taking Trulicity which can be caused of increase in A1C refilled it and she reports no side effects. ?Counseled on foods that are high in carbohydrates are the following rice, potatoes, breads, sugars, and pastas.  Reduction in the intake (eating) will assist in lowering your blood sugars.  ?-     Lipid panel ?-     CBC with Differential/Platelet ?-     HgB A1c 7.6  increased from 1/23 7.1  ? ?Essential hypertension ?-     Comprehensive metabolic panel ?-     lisinopril (ZESTRIL) 10 MG tablet; Take 1 tablet (10 mg total) by mouth daily. ? ?Mixed hyperlipidemia ? ?Uncontrolled diabetes mellitus with hyperglycemia, with long-term current use of insulin ([place]) ?-     Dulaglutide (TRULICITY) 1.5 JJ/9.4RD SOPN; Inject 1.5 mg into the skin once a week. ? ?Medication refill ?-     lisinopril (ZESTRIL) 10 MG tablet; Take 1 tablet (10 mg total) by mouth daily. ? ?Gastroesophageal reflux disease with esophagitis without hemorrhage ?-     pantoprazole (PROTONIX) 40 MG tablet; Take 1 tablet (40 mg total) by mouth daily. ? ?Other orders ?-     metFORMIN (GLUCOPHAGE-XR) 500 MG 24 hr tablet; Take 2 tablets (1,000 mg total) by mouth daily with breakfast. ? ?  ?Frederick Cervantes was seen today for diabetes. ? ?Diagnoses and all orders for this visit: ? ?Type 2 diabetes mellitus without complication, unspecified whether long term insulin use ([place]) ?-     Lipid panel ?-     CBC with Differential/Platelet ?-     HgB A1c ? ?Essential hypertension ?-     Comprehensive metabolic panel ?-     lisinopril (ZESTRIL) 10 MG tablet; Take 1 tablet (10 mg total) by mouth daily. ? ?Mixed hyperlipidemia ? ?Uncontrolled diabetes mellitus with hyperglycemia, with long-term current use of  insulin ([place]) ?-     Dulaglutide (TRULICITY) 1.5 EY/8.1KG SOPN; Inject 1.5 mg into the skin once a week. ? ?Medication refill ?-     lisinopril (ZESTRIL) 10 MG tablet; Take 1 tablet (10 mg total) by mouth daily. ? ?Gastroesophageal reflux disease with esophagitis without hemorrhage ?-  pantoprazole (PROTONIX) 40 MG tablet; Take 1 tablet (40 mg total) by mouth daily. ? ?Other orders ?-     metFORMIN (GLUCOPHAGE-XR) 500 MG 24 hr tablet; Take 2 tablets (1,000 mg total) by mouth daily with breakfast. ? ? ?I have discontinued Shanda Bumps. Al Tarawneh's omeprazole. I have also changed his lisinopril. Additionally, I am having him start on pantoprazole. Lastly, I am having him maintain his aspirin EC, True Metrix Meter, True Metrix Blood Glucose Test, TRUEplus Lancets 28G, rosuvastatin, Trulicity, and metFORMIN. ? ?Meds ordered this encounter  ?Medications  ? Dulaglutide (TRULICITY) 1.5 ER/8.4XQ SOPN  ?  Sig: Inject 1.5 mg into the skin once a week.  ?  Dispense:  2 mL  ?  Refill:  4  ? lisinopril (ZESTRIL) 10 MG tablet  ?  Sig: Take 1 tablet (10 mg total) by mouth daily.  ?  Dispense:  90 tablet  ?  Refill:  1  ? metFORMIN (GLUCOPHAGE-XR) 500 MG 24 hr tablet  ?  Sig: Take 2 tablets (1,000 mg total) by mouth daily with breakfast.  ?  Dispense:  180 tablet  ?  Refill:  2  ? pantoprazole (PROTONIX) 40 MG tablet  ?  Sig: Take 1 tablet (40 mg total) by mouth daily.  ?  Dispense:  30 tablet  ?  Refill:  3  ? ? ? ?Follow-up:  ? ?Return in about 3 months (around 03/10/2022) for DM/HTN. ? ?The above assessment and management plan was discussed with the patient. The patient verbalized understanding of and has agreed to the management plan. Patient is aware to call the clinic if symptoms fail to improve or worsen. Patient is aware when to return to the clinic for a follow-up visit. Patient educated on when it is appropriate to go to the emergency department.  ? ?Juluis Mire, NP-C ? ?  ?

## 2021-12-10 LAB — CBC WITH DIFFERENTIAL/PLATELET
Basophils Absolute: 0 10*3/uL (ref 0.0–0.2)
Basos: 0 %
EOS (ABSOLUTE): 0.1 10*3/uL (ref 0.0–0.4)
Eos: 2 %
Hematocrit: 48.8 % (ref 37.5–51.0)
Hemoglobin: 17.2 g/dL (ref 13.0–17.7)
Immature Grans (Abs): 0 10*3/uL (ref 0.0–0.1)
Immature Granulocytes: 0 %
Lymphocytes Absolute: 3.3 10*3/uL — ABNORMAL HIGH (ref 0.7–3.1)
Lymphs: 40 %
MCH: 30.8 pg (ref 26.6–33.0)
MCHC: 35.2 g/dL (ref 31.5–35.7)
MCV: 88 fL (ref 79–97)
Monocytes Absolute: 0.5 10*3/uL (ref 0.1–0.9)
Monocytes: 6 %
Neutrophils Absolute: 4.2 10*3/uL (ref 1.4–7.0)
Neutrophils: 52 %
Platelets: 266 10*3/uL (ref 150–450)
RBC: 5.58 x10E6/uL (ref 4.14–5.80)
RDW: 12.5 % (ref 11.6–15.4)
WBC: 8.2 10*3/uL (ref 3.4–10.8)

## 2021-12-10 LAB — COMPREHENSIVE METABOLIC PANEL
ALT: 31 IU/L (ref 0–44)
AST: 14 IU/L (ref 0–40)
Albumin/Globulin Ratio: 1.9 (ref 1.2–2.2)
Albumin: 4.6 g/dL (ref 4.0–5.0)
Alkaline Phosphatase: 88 IU/L (ref 44–121)
BUN/Creatinine Ratio: 14 (ref 9–20)
BUN: 11 mg/dL (ref 6–24)
Bilirubin Total: 0.3 mg/dL (ref 0.0–1.2)
CO2: 21 mmol/L (ref 20–29)
Calcium: 9.8 mg/dL (ref 8.7–10.2)
Chloride: 101 mmol/L (ref 96–106)
Creatinine, Ser: 0.81 mg/dL (ref 0.76–1.27)
Globulin, Total: 2.4 g/dL (ref 1.5–4.5)
Glucose: 160 mg/dL — ABNORMAL HIGH (ref 70–99)
Potassium: 4.3 mmol/L (ref 3.5–5.2)
Sodium: 138 mmol/L (ref 134–144)
Total Protein: 7 g/dL (ref 6.0–8.5)
eGFR: 112 mL/min/{1.73_m2} (ref >59)

## 2021-12-10 LAB — LIPID PANEL
Chol/HDL Ratio: 4.5 ratio (ref 0.0–5.0)
Cholesterol, Total: 166 mg/dL (ref 100–199)
HDL: 37 mg/dL — ABNORMAL LOW (ref >39)
LDL Chol Calc (NIH): 83 mg/dL (ref 0–99)
Triglycerides: 279 mg/dL — ABNORMAL HIGH (ref 0–149)
VLDL Cholesterol Cal: 46 mg/dL — ABNORMAL HIGH (ref 5–40)

## 2021-12-14 ENCOUNTER — Other Ambulatory Visit: Payer: Self-pay

## 2021-12-23 ENCOUNTER — Telehealth (INDEPENDENT_AMBULATORY_CARE_PROVIDER_SITE_OTHER): Payer: Self-pay

## 2021-12-23 ENCOUNTER — Other Ambulatory Visit (INDEPENDENT_AMBULATORY_CARE_PROVIDER_SITE_OTHER): Payer: Self-pay | Admitting: Primary Care

## 2021-12-23 ENCOUNTER — Other Ambulatory Visit: Payer: Self-pay

## 2021-12-23 DIAGNOSIS — E782 Mixed hyperlipidemia: Secondary | ICD-10-CM

## 2021-12-23 DIAGNOSIS — Z760 Encounter for issue of repeat prescription: Secondary | ICD-10-CM

## 2021-12-23 MED ORDER — ROSUVASTATIN CALCIUM 40 MG PO TABS
40.0000 mg | ORAL_TABLET | Freq: Every day | ORAL | 1 refills | Status: DC
  Filled 2021-12-23: qty 90, 90d supply, fill #0
  Filled 2022-01-21: qty 30, 30d supply, fill #0

## 2021-12-23 NOTE — Telephone Encounter (Signed)
Patient verified date of birth. He is aware that labs are normal other than cholesterol being higher than expected; which can increase risk of stroke/heart attack. Informed patient that medication has been sent to pharmacy( take at bedtime) to help lower cholesterol. Advised on diet and exercise. He verbalized understanding. Nat Christen, CMA  ?

## 2021-12-23 NOTE — Telephone Encounter (Signed)
-----   Message from Grayce Sessions, NP sent at 12/23/2021  9:14 AM EDT ----- ?Labs were normal except Your cholesterol higher than expected. High cholesterol may increase risk of heart attack and/or stroke. Consider eating more fruits, vegetables, and lean baked meats such as chicken or fish. Moderate intensity exercise at least 150 minutes as tolerated per week may help as well.  Sent in rosuvastatin 40mg  take at bedtime ?

## 2021-12-29 ENCOUNTER — Other Ambulatory Visit: Payer: Self-pay

## 2022-01-21 ENCOUNTER — Other Ambulatory Visit: Payer: Self-pay

## 2022-03-11 ENCOUNTER — Ambulatory Visit (INDEPENDENT_AMBULATORY_CARE_PROVIDER_SITE_OTHER): Payer: Self-pay | Admitting: Primary Care

## 2022-03-17 ENCOUNTER — Encounter (INDEPENDENT_AMBULATORY_CARE_PROVIDER_SITE_OTHER): Payer: Self-pay | Admitting: Primary Care

## 2022-03-17 ENCOUNTER — Other Ambulatory Visit: Payer: Self-pay

## 2022-03-17 ENCOUNTER — Ambulatory Visit (INDEPENDENT_AMBULATORY_CARE_PROVIDER_SITE_OTHER): Payer: Self-pay | Admitting: Primary Care

## 2022-03-17 VITALS — BP 132/79 | HR 86 | Temp 97.8°F | Ht 69.0 in | Wt 219.2 lb

## 2022-03-17 DIAGNOSIS — I10 Essential (primary) hypertension: Secondary | ICD-10-CM

## 2022-03-17 DIAGNOSIS — E119 Type 2 diabetes mellitus without complications: Secondary | ICD-10-CM

## 2022-03-17 DIAGNOSIS — K2100 Gastro-esophageal reflux disease with esophagitis, without bleeding: Secondary | ICD-10-CM

## 2022-03-17 DIAGNOSIS — Z760 Encounter for issue of repeat prescription: Secondary | ICD-10-CM

## 2022-03-17 DIAGNOSIS — E1165 Type 2 diabetes mellitus with hyperglycemia: Secondary | ICD-10-CM

## 2022-03-17 DIAGNOSIS — Z794 Long term (current) use of insulin: Secondary | ICD-10-CM

## 2022-03-17 DIAGNOSIS — E782 Mixed hyperlipidemia: Secondary | ICD-10-CM

## 2022-03-17 LAB — POCT GLYCOSYLATED HEMOGLOBIN (HGB A1C): Hemoglobin A1C: 7.6 % — AB (ref 4.0–5.6)

## 2022-03-17 MED ORDER — LISINOPRIL 10 MG PO TABS
10.0000 mg | ORAL_TABLET | Freq: Every day | ORAL | 1 refills | Status: DC
Start: 2022-03-17 — End: 2022-06-23
  Filled 2022-03-17: qty 30, 30d supply, fill #0

## 2022-03-17 MED ORDER — METFORMIN HCL ER 500 MG PO TB24
1000.0000 mg | ORAL_TABLET | Freq: Every day | ORAL | 2 refills | Status: DC
  Filled 2022-03-17: qty 60, 30d supply, fill #0
  Filled 2022-08-12: qty 60, 30d supply, fill #1
  Filled 2022-09-13: qty 60, 30d supply, fill #2
  Filled 2022-10-12: qty 60, 30d supply, fill #3
  Filled 2022-11-01: qty 60, 30d supply, fill #4
  Filled ????-??-??: fill #4

## 2022-03-17 MED ORDER — TRULICITY 1.5 MG/0.5ML ~~LOC~~ SOAJ
1.5000 mg | SUBCUTANEOUS | 4 refills | Status: DC
  Filled 2022-03-17: qty 2, 28d supply, fill #0
  Filled 2022-04-20: qty 2, 28d supply, fill #1

## 2022-03-17 MED ORDER — TRUE METRIX BLOOD GLUCOSE TEST VI STRP
ORAL_STRIP | 2 refills | Status: DC
  Filled 2022-03-17: qty 100, 90d supply, fill #0

## 2022-03-17 MED ORDER — ROSUVASTATIN CALCIUM 40 MG PO TABS
40.0000 mg | ORAL_TABLET | Freq: Every day | ORAL | 1 refills | Status: DC
  Filled 2022-03-17: qty 30, 30d supply, fill #0
  Filled 2022-08-12: qty 30, 30d supply, fill #1
  Filled 2022-09-13: qty 30, 30d supply, fill #2

## 2022-03-17 MED ORDER — PANTOPRAZOLE SODIUM 40 MG PO TBEC
40.0000 mg | DELAYED_RELEASE_TABLET | Freq: Every day | ORAL | 3 refills | Status: DC
  Filled 2022-03-17: qty 30, 30d supply, fill #0
  Filled 2022-08-12: qty 30, 30d supply, fill #1
  Filled 2022-09-13: qty 30, 30d supply, fill #2
  Filled 2022-10-12: qty 30, 30d supply, fill #3

## 2022-03-17 MED ORDER — TRUEPLUS LANCETS 28G MISC
2 refills | Status: AC
  Filled 2022-03-17: qty 100, 90d supply, fill #0

## 2022-03-17 NOTE — Progress Notes (Signed)
Subjective:  Patient ID: Frederick Cervantes, male    DOB: August 27, 1976  Age: 46 y.o. MRN: [id number]  CC: Follow-up (DM/HTN)   HPI Frederick Cervantes presents forFollow-up of diabetes. Patient does  check blood sugar at home. Admits to being stressed and busy and did not take his medications for approximately 3 weeks. Hypertension acceptable- Patient has No headache, No chest pain, No abdominal pain - No Nausea, No new weakness tingling or numbness, No Cough - shortness of breath   Compliant with meds - No Checking CBGs? Yes  Fasting avg - 85-130  Postprandial average -  Exercising regularly? - Yes Watching carbohydrate intake? - Yes/trying  Neuropathy ? - No Hypoglycemic events - No  - Recovers with :   Pertinent ROS:  Polyuria - No Polydipsia - Yes Vision problems - Yes- Recommended scheduling a diabetic eye exam- list will be given at discharge   Medications as noted below. Taking them regularly without complication/adverse reaction being reported today.   History [place] has a past medical history of Diabetes mellitus without complication ([place]), Hypertension, and Ulcer, stomach peptic.   He has a past surgical history that includes No past surgeries.   His family history is not on file.He reports that he has been smoking cigarettes. He has been smoking an average of 1 pack per day. He has never used smokeless tobacco. He reports that he does not drink alcohol and does not use drugs.  Current Outpatient Medications on File Prior to Visit  Medication Sig Dispense Refill   lisinopril (ZESTRIL) 10 MG tablet Take 1 tablet (10 mg total) by mouth daily. 90 tablet 1   metFORMIN (GLUCOPHAGE-XR) 500 MG 24 hr tablet Take 2 tablets (1,000 mg total) by mouth daily with breakfast. 180 tablet 2   pantoprazole (PROTONIX) 40 MG tablet Take 1 tablet (40 mg total) by mouth daily. 30 tablet 3   rosuvastatin (CRESTOR) 40 MG tablet Take 1 tablet (40 mg total) by mouth daily. 90 tablet 1   aspirin EC  81 MG tablet Take 81 mg by mouth daily as needed ("for hypertension"). (Patient not taking: Reported on 09/10/2021)     Blood Glucose Monitoring Suppl (TRUE METRIX METER) w/Device KIT Use to check blood sugar once daily. (Patient not taking: Reported on 09/10/2021) 1 kit 0   Dulaglutide (TRULICITY) 1.5 MB/5.5HR SOPN Inject 1.5 mg into the skin once a week. 2 mL 4   glucose blood (TRUE METRIX BLOOD GLUCOSE TEST) test strip Use to check blood sugar once daily. (Patient not taking: Reported on 09/10/2021) 100 each 2   TRUEplus Lancets 28G MISC Use to check blood sugar once daily. (Patient not taking: Reported on 09/10/2021) 100 each 2   No current facility-administered medications on file prior to visit.    ROS Comprehensive ROS Pertinent positive and negative noted in HPI    Objective:  BP 132/79   Pulse 86   Temp 97.8 F (36.6 C) (Oral)   Ht _0  (1.753 m)   Wt 219 lb 3.2 oz (99.4 kg)   SpO2 95%   BMI 32.37 kg/m   BP Readings from Last 3 Encounters:  03/17/22 132/79  12/09/21 138/87  09/10/21 131/82    Wt Readings from Last 3 Encounters:  03/17/22 219 lb 3.2 oz (99.4 kg)  12/09/21 219 lb (99.3 kg)  09/10/21 215 lb (97.5 kg)    Physical Exam Vitals reviewed.  Constitutional:      Appearance: He is obese.  HENT:  Head: Normocephalic.     Right Ear: Tympanic membrane and external ear normal.     Left Ear: Tympanic membrane and external ear normal.     Nose: Nose normal.  Eyes:     Extraocular Movements: Extraocular movements intact.  Cardiovascular:     Rate and Rhythm: Normal rate and regular rhythm.  Pulmonary:     Effort: Pulmonary effort is normal.     Breath sounds: Normal breath sounds.  Abdominal:     General: Abdomen is flat. Bowel sounds are normal. There is distension.  Musculoskeletal:        General: Normal range of motion.     Cervical back: Normal range of motion and neck supple.  Skin:    General: Skin is warm and dry.  Neurological:     Mental  Status: He is alert and oriented to person, place, and time.  Psychiatric:        Mood and Affect: Mood normal.        Behavior: Behavior normal.   Lab Results  Component Value Date   HGBA1C 7.6 (A) 03/17/2022   HGBA1C 7.6 (A) 12/09/2021   HGBA1C 7.1 (A) 09/10/2021    Lab Results  Component Value Date   WBC 8.2 12/09/2021   HGB 17.2 12/09/2021   HCT 48.8 12/09/2021   PLT 266 12/09/2021   GLUCOSE 160 (H) 12/09/2021   CHOL 166 12/09/2021   TRIG 279 (H) 12/09/2021   HDL 37 (L) 12/09/2021   LDLCALC 83 12/09/2021   ALT 31 12/09/2021   AST 14 12/09/2021   NA 138 12/09/2021   K 4.3 12/09/2021   CL 101 12/09/2021   CREATININE 0.81 12/09/2021   BUN 11 12/09/2021   CO2 21 12/09/2021   INR 1.04 11/25/2017   HGBA1C 7.6 (A) 03/17/2022     Assessment & Plan:  Draco was seen today for follow-up.  Diagnoses and all orders for this visit:  Type 2 diabetes mellitus without complication, unspecified whether long term insulin use ([facility]) -     HgB A1c 7.6  Continue monitoring foods that are high in carbohydrates are the following rice, potatoes, breads, sugars, and pastas.  Reduction in the intake (eating) will assist in lowering your blood sugars.  -     TRUEplus Lancets 28G MISC; Use to check blood sugar once daily. -     glucose blood (TRUE METRIX BLOOD GLUCOSE TEST) test strip; Use to check blood sugar once daily.  Medication refill -     rosuvastatin (CRESTOR) 40 MG tablet; Take 1 tablet (40 mg total) by mouth daily. -     lisinopril (ZESTRIL) 10 MG tablet; Take 1 tablet (10 mg total) by mouth daily.   Dulaglutide (TRULICITY) 1.5 KX/3.8HW SOPN; Inject 1.5 mg into the skin once a week. Mixed hyperlipidemia  Healthy lifestyle diet of fruits vegetables fish nuts whole grains and low saturated fat . Foods high in cholesterol or liver, fatty meats,cheese, butter avocados, nuts and seeds, chocolate and fried foods. -     rosuvastatin (CRESTOR) 40 MG tablet; Take 1 tablet (40 mg total) by  mouth daily.  Gastroesophageal reflux disease with esophagitis without hemorrhage -     pantoprazole (PROTONIX) 40 MG tablet; Take 1 tablet (40 mg total) by mouth daily.  Essential hypertension BP goal - < 130/80 Explained that having normal blood pressure is the goal and medications are helping to get to goal and maintain normal blood pressure. DIET: Limit salt intake, read nutrition labels to  check salt content, limit fried and high fatty foods  Avoid using multisymptom OTC cold preparations that generally contain sudafed which can rise BP. Consult with pharmacist on best cold relief products to use for persons with HTN EXERCISE Discussed incorporating exercise such as walking - 30 minutes most days of the week and can do in 10 minute intervals    -     lisinopril (ZESTRIL) 10 MG tablet; Take 1 tablet (10 mg total) by mouth daily.  Uncontrolled diabetes mellitus with hyperglycemia, with long-term current use of insulin ([facility]) -     Other orders -     metFORMIN (GLUCOPHAGE-XR) 500 MG 24 hr tablet; Take 2 tablets (1,000 mg total) by mouth daily with breakfast.      I am having Shanda Bumps. Al Tarawneh maintain his aspirin EC, True Metrix Meter, True Metrix Blood Glucose Test, TRUEplus Lancets 30T, Trulicity, lisinopril, metFORMIN, pantoprazole, and rosuvastatin.  No orders of the defined types were placed in this encounter.    Follow-up:   No follow-ups on file.  The above assessment and management plan was discussed with the patient. The patient verbalized understanding of and has agreed to the management plan. Patient is aware to call the clinic if symptoms fail to improve or worsen. Patient is aware when to return to the clinic for a follow-up visit. Patient educated on when it is appropriate to go to the emergency department.   Juluis Mire, NP-C

## 2022-03-17 NOTE — Patient Instructions (Signed)
??? ?????? ???????? ??? ???????? ?Diabetes Mellitus and Nutrition, Adult ?????? ????? ?? ?????? ?? ??? ??????? ???? ?? ??????? ???? ?? ???? ????? ????? ???? ???? ??? ??????? ??? ???? (????????) ????? ???? ???? ??? ????? ??????. ?? ????? ??????? ?????? ?????? ?????? ?? ???????? ????? ??????? ?? ???? ?????? ???: ? ??????? ??? ???? ???????? ?? ????. ? ????? ????? ??????? ?????? ?????. ? ????? ??? ????. ? ?????? ??? ????? ????? ??????? ????. ??? ???? ???? ?? ???? ??? ??? ??????? ?????? ??? ?????? ?? ??? ????? ?????? ?? ????? ???? ??? ???????? ?????? ??????? ???? ???????. ??? ?? ???? ????? ??????? ?????? ?? ?????? ?? ?????? ???? ?????? ???? ??? ????? ?????? ????? ?? ????. ??? ????? ??? ?????? ????? ?????? ???: ? ??? ??????? ???????? ???? ??????. ? ??????? ???? ????????. ? ????. ? ???? ?????? ???? ???? ???? ????????????. ? ????? ?????? ???? ??????. ? ??????? ?????? ?????? ???? ????? ????? ??? ??? ????? ?? ??? ??????. ???? ???? ???????????? ????? ????? ???????????? ??? ????? ???????? ????? ???? ?? ?? ??? ??? ?? ??????? ????????. ?????? ?? ????? ???????????? ???? ???? ???????? ?? ????. ???? ?? ????? ?? ???? ???? ???????????? ???? ???? ?? ???????? ????? ?? ?? ????. ????? ??? ??????? ??????? ??? ???. ????? ?? ?????? ??????? ??????? ?? ???? ???? ???????????? ???? ????? ???? ??????? ?? ?? ???? ????? ?? ???? ?????. ???? ???? ??????? ????????? ????? ?????????? ???????? ???? ?? ???? ???? ?????? ???? (??? ??? ????)? ????? ??? ??? ?????? ????????? ?? ????? ????? ?????? ???????? ?? ???? ????. ???? ??? ???? ???? ????? ?? ???? ??????. ?? ????? ??? ??? ???? ???????? ??????? ??????? ?????? ???? ????? ??????? ?? ????? ?????????. ? ?? ?????? ????????? ??????? ?? ??????? ???????: ?? ??? ????? ???? ??????? ?????? ??????? ?????? ???? ????? ?????????.  ?? ??? ???? ??????? ?? ?????? ?????? ?? ??????? ????? ?? ?????? ??????. ? ??? ??? ?????? ????????? ????????: ?? ??? ???? ??????? ???? ???????? ??? ????? ??????: ?? ????? ???? ??? ???? ??????.   ?? ??????? ??? ???? ??????. ?? ???? ???? ?????? ?? ?? ????? ???????. ?? ???????? ???????? ????? ??????? ?????? ???? ??? 12 ????? (355 ??) ?? ?????? ?? ??? ??? 5 ?????? (148 ??) ?? ?????? ?? ??? ??? ????? ???? (44 ??) ?? ????????? ???????? ??????. ?? ???? ?????? ?? ???? ?????? ??? ????? ?? ?????? ???? ?? ????? ??? ????? ?????? ?? ???????? ??? ???????. ?? ?????? ?? ?????? ???????? ????????? ?????????? ???????? ?????? ?? ????? ??? ???? ????? ?? ????? ???? ?? ????? ??? ????????????. ??? ??????? ??????? ???? ?????? ?? ????? ??? ?????? ? ?????? ?????? ????????? ???????? ? ???? ??????? ?? ????? ??????? ??? ???? ????????? ???????? ("Nutrition Facts") ??? ??????? ?????????? ???????. ?????? ?? ??? ??????? ???????? ?????? ???????????? ??????? ???????? ???????? ?????? ?????? ??? ?????? ???? ??? ?? ???? ???? ?? ????? ??????? ??????. ?? ???? ?????? ?? ???????? ????? ?????? ??????? ??? ???? ?? ????? ?????. ? ???? ?? ?????? ???????????? (?????????) ?? ?? ??? ????. ? ???? ?? ??? ?????? ?????? ??????? ??????? ???????? ?? ????? ???????. ????? ??????? ???? ????? ??? ???? ????? ?? ??? ?????? ?? ??????? ????. ? ???? ?? ??? ????????? (????) ??? ?????? (????????) ?? ????? ??????? ??????. ????? ??? ???? ??????? ????? ???? ???????? ????? ??? ?? 2,300 ??????? ?? ?????. ? ???? ?????? ??? ?????? ??????? ?????? ???????? ??? ??????? ?????? ????? "?????? ??????" "low-fat" ?? "?? ????? ??? ????" ?? "????? ?? ??????" "nonfat". ???? ??????? ?? ????? ??? ????? ???? ?? ????? ?????? ?? ???????????? ??????? ???? ??????. ? ???? ??? ??????? ????? ???????? ?????? ??????? ??????? ?? ?????? ???????? ??????? ??? ??????. ??????? ? ???? ????? ??????? ??????? ?? ?????? ?????? ?? ??????? ????????. ???? ??????? ????? ?????? ??? ????? ????? ?? ????????? ?????? ?????? ???????. ? ????? ?? ?????? ??????? ?? ???? ??????? ????????. ???? ?? ?????? ???? ???? ??? ?????? ??????? ?????????? ???????? ??????? ???????? ??????? ???????? ??????? ??????? ???????. ?????? ? ??????  ?????? ????? ??? ????? ??????? ??? ?????? ????? ?? ????? ??? ????? ????? ??????? ??? ????? ??????. ? ?????? ?????? ?????? ?? ????? ??? ???? ??????? ?? ???????? ?? ????? ?????. ? ???? ????? ??????? ?? ??????? ?? ?????? ????? ??????. ?????? ??????? ? ????? ??????? ???????? ???????? ??????? ???????? ????? ?? ????? ???? ?? ???. ???? ?????? ???? ????? ??? ????? ???? ???? ????? ?????. ? ????? ??????? ?????? ???????? ??? ??????? ??????? ????????? ?????????? ??????? ???????. ? ?????   4-6 ?????? (112-168 ????) ?? ?????????? ????? ?????? ?? ??? ??? ??????? ????? ?????? ?? ?????? ??????? ????????? ?? ?????? ?? ??????. ???????? (???????) (?????? 28 ??) ??????? ?? ?????????? ????? ?????? ??????: ?? ????? ????? (28 ????) ?? ?????? ?? ??????? ?? ???????. ?? ???? ?????. ?? ? ??? (62 ??????) ?? ??????. ? ????? ?????? ??? ??????? ???? ????? ??? ???? ????? ??? ????????? ????????? ??????? ????????. ??? ??????? ???? ??? ?? ????????? ???????? ??????. ??????. ????????. ?????. ??????. ???????. ?????. ???????. ????????. ??????. ??????. ?????. ?????????? ?????????? ???????? ??? ?? ??? ???? ???????? ??????? ?? ???????? ?????? ?? ???? ?????? ??????? ?????? ???????? ???????. ???????. ????????. ????????. ?????. ????? ??????. ???????. ??????. ?????. ??????. ??????? ???????? (???? ??????). ??????? ??????? ??????? ??? ????? ?????? ?? ??????? ?????? ??????? ?????????? ?????????? ??????? ?????????. ??????? ??? ???????. ???????. ????? ????? ?? ?????. ??????? ??????????? ?????? ?????????? ???????. ??????? ???? ?????. ??? ??????? ?????? ??????. ??????. ????????. ??????. ??????? ??????? ??????? ??????? ?????? ????? ?? ?????? ????? ??? ?????? ???????? ??????. ???????? ???????? ????? ?? ?? ???? ??????? ??????? ?? ????????? ?????????? ???? ????? ???????. ??? ????? ??????? ??????? ?????? ?????? ?? ?????????. ??? ??????? ?????? ??????? ???????? ???????? ??????? ??? ???????. ?????????? ?????????? ???????. ???????? ??????? ??????? ???? ?????? ?? ????  ???????. ??????? ??????? ?????? ?????? ?????? ??????? ?????? ??? ?????? ?????????? ???????? ??????? ???????. ???? ???? ??????? ????????. ??????? ??????????? ?????? ???? ????? ?????? ??????. ??????? ?? ??????. ????? ?????? ?????? ?? ??????. ?????? ???????. ???? ?????? ???????. ??????? ??????? ?????? ?? ??? ?? ???? ???? ?????. ?????????? ?????????? ???????? ??? ????? ?????? ?? ??????. ????? ?? ???? ??????? ??????? ????? ??????? ??????? ?? ????????? ?????????? ???? ????? ???? ??????. ??? ????? ??????? ??????? ?????? ?????? ?? ?????????. ?????? ????? ????? ??? ??????? ??????? ?????? ??????? ?? ? ?? ????? ??? ??? ???? ?? ??????? ????? ?????? ???? ?????? ????????? ? ?? ????? ??? ??????? ??????? ?????? ? ?? ??? ?????? ???? ?????? ??????? ?? ???? ?? ??? ?????? ? ?? ???? ??? ???? ?????? ????? ?????? ?????? ??? ?????? ?? ?????????: ? ??????? ????????? ?????? (American Diabetes Association):? [email or web address] ? ???????? ??????? ???????? ???????? (Academy of Nutrition and Dietetics):? [email or web address] ? ?????? ?????? ?????? ?????? ?????? ?????? ?????? (National Institute of Diabetes and Digestive and Kidney Diseases):? [email or web address] ? ????? ?????? ????? ???? ?????? ???????? (Association of Diabetes Care & Education Specialists):? [email or web address] ????? ? ?? ??????? ???? ?? ???? ????? ????? ???? ???? ??? ??????? ??? ???? (????????) ????? ???? ???? ??? ????? ??????. ??? ??????? ??? ?????? ????????? ???????? ??? ????. ? ??? ??? ??????? ?????? ??????? ??? ??????? ??? ???? ???????? ?? ???? ????? ?? ???????? ??????? ?????? ?????. ? ??? ?? ???? ????? ??????? ?????? ?? ?????? ?? ?????? ???? ?????? ???? ??? ????? ?????? ????? ?? ????. ???? ????? ?? ??? ????????? ?? ???? ?????? ????????? ???? ?????? ???? ??????? ??????. ???? ?? ?????? ??? ????? ???? ?? ???? ?? ???? ??????? ??????.? ?Document Revised: 04/30/2020 Document Reviewed: 04/30/2020 ?Elsevier Patient Education ? 2023 [employer] Inc. ? ?

## 2022-03-18 ENCOUNTER — Other Ambulatory Visit: Payer: Self-pay

## 2022-04-20 ENCOUNTER — Other Ambulatory Visit: Payer: Self-pay

## 2022-04-26 ENCOUNTER — Other Ambulatory Visit: Payer: Self-pay

## 2022-06-22 ENCOUNTER — Ambulatory Visit (INDEPENDENT_AMBULATORY_CARE_PROVIDER_SITE_OTHER): Payer: Self-pay | Admitting: Primary Care

## 2022-06-23 ENCOUNTER — Ambulatory Visit (INDEPENDENT_AMBULATORY_CARE_PROVIDER_SITE_OTHER): Payer: Self-pay | Admitting: Primary Care

## 2022-06-23 ENCOUNTER — Other Ambulatory Visit: Payer: Self-pay

## 2022-06-23 ENCOUNTER — Encounter (INDEPENDENT_AMBULATORY_CARE_PROVIDER_SITE_OTHER): Payer: Self-pay | Admitting: Primary Care

## 2022-06-23 VITALS — BP 126/81 | HR 85 | Resp 16 | Wt 218.6 lb

## 2022-06-23 DIAGNOSIS — E119 Type 2 diabetes mellitus without complications: Secondary | ICD-10-CM

## 2022-06-23 DIAGNOSIS — Z760 Encounter for issue of repeat prescription: Secondary | ICD-10-CM

## 2022-06-23 DIAGNOSIS — Z6832 Body mass index (BMI) 32.0-32.9, adult: Secondary | ICD-10-CM

## 2022-06-23 DIAGNOSIS — K0889 Other specified disorders of teeth and supporting structures: Secondary | ICD-10-CM

## 2022-06-23 DIAGNOSIS — Z114 Encounter for screening for human immunodeficiency virus [HIV]: Secondary | ICD-10-CM

## 2022-06-23 DIAGNOSIS — I10 Essential (primary) hypertension: Secondary | ICD-10-CM

## 2022-06-23 DIAGNOSIS — E6609 Other obesity due to excess calories: Secondary | ICD-10-CM

## 2022-06-23 DIAGNOSIS — Z1159 Encounter for screening for other viral diseases: Secondary | ICD-10-CM

## 2022-06-23 LAB — POCT GLYCOSYLATED HEMOGLOBIN (HGB A1C): HbA1c, POC (controlled diabetic range): 8 % — AB (ref 0.0–7.0)

## 2022-06-23 MED ORDER — TRULICITY 1.5 MG/0.5ML ~~LOC~~ SOAJ
1.5000 mg | SUBCUTANEOUS | 4 refills | Status: DC
  Filled 2022-06-23: qty 2, 28d supply, fill #0
  Filled 2022-08-16: qty 2, 28d supply, fill #1
  Filled 2022-09-13: qty 2, 28d supply, fill #2
  Filled 2022-10-12: qty 2, 28d supply, fill #3
  Filled 2022-11-08 (×2): qty 2, 28d supply, fill #4

## 2022-06-23 MED ORDER — IBUPROFEN 800 MG PO TABS
800.0000 mg | ORAL_TABLET | Freq: Three times a day (TID) | ORAL | 0 refills | Status: AC | PRN
  Filled 2022-06-23: qty 90, 30d supply, fill #0

## 2022-06-23 MED ORDER — LISINOPRIL 10 MG PO TABS
10.0000 mg | ORAL_TABLET | Freq: Every day | ORAL | 1 refills | Status: DC
  Filled 2022-06-23: qty 30, 30d supply, fill #0
  Filled 2022-08-12: qty 30, 30d supply, fill #1
  Filled 2022-09-13: qty 30, 30d supply, fill #2
  Filled 2022-10-12: qty 30, 30d supply, fill #3
  Filled 2022-11-08 (×2): qty 30, 30d supply, fill #4

## 2022-06-23 NOTE — Patient Instructions (Signed)
Dental Pain ?Dental pain is often a sign that something is wrong with your teeth or gums. It is also something that can occur following dental treatment. If you have dental pain, it is important to contact your dental care provider, especially if the cause of the pain has not been determined. Dental pain may be of varying intensity and can be caused by many things, including: ?Tooth decay (cavities or caries). Cavities are caused by bacteria that produce acids that irritate the nerve of your tooth, making it sensitive to air and hot or cold temperatures. This eventually causes discomfort or pain. ?Abscess or infection. Once the bacteria reach the inner part of the tooth (pulp), a bacterial infection (dental abscess) can occur. Pus typically collects at the end of the root of a tooth. ?Injury. ?A crack in the tooth. ?Gum recession exposing the root, and possibly the nerves, of a tooth. ?Gum (periodontal)disease. ?Abnormal grinding or clenching. ?Poor or improper home care. ?An unknown reason (idiopathic). ?Your pain may be mild or severe. It may occur when you are: ?Chewing. ?Exposed to hot or cold temperatures. ?Eating or drinking sugary foods or beverages, such as soda or candy. ?Your pain may be constant, or it may come and go without cause. ?Follow these instructions at home: ?The following actions may help to lessen any discomfort that you are feeling before or after getting dental care. ?Medicines ?Take over-the-counter and prescription medicines only as told by your dental care provider. ?If you were prescribed an antibiotic medicine, take it as told by your dental care provider. Do not stop taking the antibiotic even if you start to feel better. ?Eating and drinking ?Avoid foods or drinks that cause you pain, such as: ?Very hot or very cold foods or drinks. ?Sweet or sugary foods or drinks. ?Managing pain and swelling ? ?Ice can sometimes be used to reduce pain and swelling, especially if the pain is  following dental treatment. ?If directed, put ice on the painful area of your face. To do this: ?Put ice in a plastic bag. ?Place a towel between your skin and the bag. ?Leave the ice on for 20 minutes, 2-3 times a day. ?Remove the ice if your skin turns bright red. This is very important. If you cannot feel pain, heat, or cold, you have a greater risk of damage to the area. ?Brushing your teeth ?To keep your mouth and gums healthy, brush your teeth twice a day using a fluoride toothpaste. ?Use a toothpaste made for sensitive teeth as directed by your dental care provider, especially if the root is exposed. ?Always brush your teeth with a soft-bristled toothbrush. This will help prevent irritation to your gums. ?General instructions ?Floss at least once a day. ?Do not apply heat to the outside of the face. ?Gargle with a mixture of salt and water 3-4 times a day or as needed. To make salt water, completely dissolve ?-1 tsp (3-6 g) of salt in 1 cup (237 mL) of warm water. ?Keep all follow-up visits. This is important. ?Contact a dental care provider if: ?You have any unexplained dental pain. ?Your pain is not controlled with medicines. ?Your symptoms get worse. ?You have new symptoms. ?Get help right away if: ?You are unable to open your mouth. ?You are having trouble breathing or swallowing. ?You have a fever. ?You notice that your face, neck, or jaw is swollen. ?These symptoms may represent a serious problem that is an emergency. Do not wait to see if the symptoms will go   away. Get medical help right away. Call your local emergency services (911 in the [place] Do not drive yourself to the hospital. ?Summary ?Dental pain may be caused by many things, including tooth decay and infection. ?Your pain may be mild or severe. ?Take over-the-counter and prescription medicines only as told by your dental care provider. ?Watch your dental pain for any changes. Let your dental care provider know if your symptoms get  worse. ?This information is not intended to replace advice given to you by your health care provider. Make sure you discuss any questions you have with your health care provider. ?Document Revised: 05/28/2020 Document Reviewed: 05/28/2020 ?Elsevier Patient Education ? 2023 Elsevier Inc. ? ?

## 2022-06-23 NOTE — Progress Notes (Signed)
Subjective:  Patient ID: Frederick Cervantes, male    DOB: March 09, 1976  Age: 46 y.o. MRN: [id number]  CC: Diabetes, hypertension, dental pain   HPI Mr.Tejuan Gholson is a 46 year old obese ma male  who presents for follow-up of diabetes. Patient does not check blood sugar at home.Compliant with meds - Yes Checking CBGs? No  Fasting avg -   Postprandial average -  Exercising regularly? - Yes Watching carbohydrate intake? - Yes Neuropathy ? - No Hypoglycemic events - No  - Recovers with :   Pertinent ROS:  Polyuria - No Polydipsia - No Vision problems - No Has polyphagia    Blood pressure is well controlled Patient has No headache, No chest pain, No abdominal pain - No Nausea, No new weakness tingling or numbness, No Cough - shortness of breath. His main concern is broken teeth on the right side upper which causing pain sensitivity to heat and cold. Medications as noted below. Taking them regularly without complication/adverse reaction being reported today.   History Maika has a past medical history of Diabetes mellitus without complication ([place]), Hypertension, and Ulcer, stomach peptic.   He has a past surgical history that includes No past surgeries.   His family history is not on file.He reports that he has been smoking cigarettes. He has been smoking an average of 1 pack per day. He has never used smokeless tobacco. He reports that he does not drink alcohol and does not use drugs.  Current Outpatient Medications on File Prior to Visit  Medication Sig Dispense Refill   aspirin EC 81 MG tablet Take 81 mg by mouth daily as needed ("for hypertension"). (Patient not taking: Reported on 09/10/2021)     Blood Glucose Monitoring Suppl (TRUE METRIX METER) w/Device KIT Use to check blood sugar once daily. (Patient not taking: Reported on 09/10/2021) 1 kit 0   glucose blood (TRUE METRIX BLOOD GLUCOSE TEST) test strip Use to check blood sugar once daily. 100 each 2   metFORMIN (GLUCOPHAGE-XR)  500 MG 24 hr tablet Take 2 tablets (1,000 mg total) by mouth daily with breakfast. 180 tablet 2   pantoprazole (PROTONIX) 40 MG tablet Take 1 tablet (40 mg total) by mouth daily. 30 tablet 3   rosuvastatin (CRESTOR) 40 MG tablet Take 1 tablet (40 mg total) by mouth daily. 90 tablet 1   TRUEplus Lancets 28G MISC Use to check blood sugar once daily. 100 each 2   No current facility-administered medications on file prior to visit.    ROS Comprehensive ROS Pertinent positive and negative noted in HPI    Objective:  BP 126/81   Pulse 85   Resp 16   Wt 218 lb 9.6 oz (99.2 kg)   SpO2 96%   BMI 32.28 kg/m   BP Readings from Last 3 Encounters:  06/23/22 126/81  03/17/22 132/79  12/09/21 138/87    Wt Readings from Last 3 Encounters:  06/23/22 218 lb 9.6 oz (99.2 kg)  03/17/22 219 lb 3.2 oz (99.4 kg)  12/09/21 219 lb (99.3 kg)    Physical exam: General: Vital signs reviewed.  Patient is well-developed and well-nourished,obese male  in acute distress due to dental carries and chipped teachand cooperative with exam. Head: Normocephalic and atraumatic. Eyes: EOMI, conjunctivae normal, no scleral icterus. Neck: Supple, trachea midline, normal ROM, no JVD, masses, thyromegaly, or carotid bruit present. Cardiovascular: RRR, S1 normal, S2 normal, no murmurs, gallops, or rubs. Pulmonary/Chest: Clear to auscultation bilaterally, no wheezes, rales, or  rhonchi. Abdominal: Soft, non-tender, non-distended, BS +, no masses, organomegaly, or guarding present. Musculoskeletal: No joint deformities, erythema, or stiffness, ROM full and nontender. Extremities: No lower extremity edema bilaterally,  pulses symmetric and intact bilaterally. No cyanosis or clubbing. Neurological: A&O x3, Strength is normal Skin: Warm, dry and intact. No rashes or erythema. Psychiatric: Normal mood and affect. speech and behavior is normal. Cognition and memory are normal.    Lab Results  Component Value Date   HGBA1C  8.0 (A) 06/23/2022   HGBA1C 7.6 (A) 03/17/2022   HGBA1C 7.6 (A) 12/09/2021    Lab Results  Component Value Date   WBC 8.2 12/09/2021   HGB 17.2 12/09/2021   HCT 48.8 12/09/2021   PLT 266 12/09/2021   GLUCOSE 160 (H) 12/09/2021   CHOL 166 12/09/2021   TRIG 279 (H) 12/09/2021   HDL 37 (L) 12/09/2021   LDLCALC 83 12/09/2021   ALT 31 12/09/2021   AST 14 12/09/2021   NA 138 12/09/2021   K 4.3 12/09/2021   CL 101 12/09/2021   CREATININE 0.81 12/09/2021   BUN 11 12/09/2021   CO2 21 12/09/2021   INR 1.04 11/25/2017   HGBA1C 8.0 (A) 06/23/2022     Assessment & Plan:  Calbert was seen today for diabetes, hypertension and dental pain.  Diagnoses and all orders for this visit:  Type 2 diabetes mellitus without complication, unspecified whether long term insulin use (HCC) -     POCT glycosylated hemoglobin (Hb A1C) 8.0 gradually increase will refer to [facility] clinical pharmacist - educated on lifestyle modifications, including but not limited to diet choices and adding exercise to daily routine.   -     Microalbumin / creatinine urine ratio -     lisinopril (ZESTRIL) 10 MG tablet; Take 1 tablet (10 mg total) by mouth daily. -     Dulaglutide (TRULICITY) 1.5 ZC/5.8IF SOPN; Inject 1.5 mg into the skin once a week.  Pain, dental Sensitivity to heat and cold with 3 children Tupper right -     Ambulatory referral to Dentistry  Essential hypertension she still has not given me here Blood pressure well controlled, monitoring sodium intake on monotherapy and for renal protection -     lisinopril (ZESTRIL) 10 MG tablet; Take 1 tablet (10 mg total) by mouth daily.  Class 1 obesity due to excess calories with serious comorbidity and body mass index (BMI) of 32.0 to 32.9 in adult Obesity is 30-39 indicating an excess in caloric intake or underlining conditions. This may have lead to hypertension and/or diabetes. Educated on lifestyle modifications of diet and exercise which may reduce obesity.     Encounter for HCV screening test for low risk patient -     HCV Ab w Reflex to Quant PCR  Encounter for screening for HIV -     HIV Antibody (routine testing w rflx)  Medication refill -     lisinopril (ZESTRIL) 10 MG tablet; Take 1 tablet (10 mg total) by mouth daily. -     Dulaglutide (TRULICITY) 1.5 OY/7.7AJ SOPN; Inject 1.5 mg into the skin once a week.  Other orders -     ibuprofen (ADVIL) 800 MG tablet; Take 1 tablet (800 mg total) by mouth every 8 (eight) hours as needed.  I am having Shanda Bumps. Al Tarawneh start on ibuprofen. I am also having him maintain his aspirin EC, True Metrix Meter, TRUEplus Lancets 28G, rosuvastatin, pantoprazole, metFORMIN, True Metrix Blood Glucose Test, lisinopril, and Trulicity.  Meds  ordered this encounter  Medications   lisinopril (ZESTRIL) 10 MG tablet    Sig: Take 1 tablet (10 mg total) by mouth daily.    Dispense:  90 tablet    Refill:  1    Order Specific Question:   Supervising Provider    Answer:   Tresa Garter [[id number]]   Dulaglutide (TRULICITY) 1.5 PH/4.3EX SOPN    Sig: Inject 1.5 mg into the skin once a week.    Dispense:  2 mL    Refill:  4    Order Specific Question:   Supervising Provider    Answer:   Tresa Garter [[id number]]   ibuprofen (ADVIL) 800 MG tablet    Sig: Take 1 tablet (800 mg total) by mouth every 8 (eight) hours as needed.    Dispense:  90 tablet    Refill:  0    Order Specific Question:   Supervising Provider    Answer:   Tresa Garter [[id number]]     Follow-up:   Return in about 3 months (around 09/23/2022) for DM/fasting .  The above assessment and management plan was discussed with the patient. The patient verbalized understanding of and has agreed to the management plan. Patient is aware to call the clinic if symptoms fail to improve or worsen. Patient is aware when to return to the clinic for a follow-up visit. Patient educated on when it is appropriate to go to the emergency department.    Juluis Mire, NP-C

## 2022-06-24 LAB — SPECIMEN STATUS REPORT

## 2022-06-25 ENCOUNTER — Telehealth (INDEPENDENT_AMBULATORY_CARE_PROVIDER_SITE_OTHER): Payer: Self-pay | Admitting: Primary Care

## 2022-06-25 NOTE — Telephone Encounter (Signed)
Copied from [place] [phone]. Topic: General - Other >> Jun 25, 2022  4:43 PM Sabas Sous wrote: Reason for CRM: Trina from [facility] called needing to speak to the office regarding a urine sample that is missing information. Tried calling office twice   Best contact: [phone]

## 2022-06-26 LAB — HCV INTERPRETATION

## 2022-06-26 LAB — HIV ANTIBODY (ROUTINE TESTING W REFLEX): HIV Screen 4th Generation wRfx: NONREACTIVE

## 2022-06-26 LAB — HCV AB W REFLEX TO QUANT PCR: HCV Ab: NONREACTIVE

## 2022-06-26 LAB — SPECIMEN STATUS REPORT

## 2022-08-02 ENCOUNTER — Ambulatory Visit: Payer: Medicaid Other | Admitting: Pharmacist

## 2022-08-12 ENCOUNTER — Other Ambulatory Visit: Payer: Self-pay

## 2022-08-16 ENCOUNTER — Other Ambulatory Visit: Payer: Self-pay

## 2022-09-13 ENCOUNTER — Other Ambulatory Visit: Payer: Self-pay

## 2022-09-28 ENCOUNTER — Ambulatory Visit (INDEPENDENT_AMBULATORY_CARE_PROVIDER_SITE_OTHER): Payer: Self-pay | Admitting: Primary Care

## 2022-09-28 ENCOUNTER — Encounter (INDEPENDENT_AMBULATORY_CARE_PROVIDER_SITE_OTHER): Payer: Self-pay | Admitting: Primary Care

## 2022-09-28 VITALS — BP 133/88 | HR 83 | Resp 16 | Ht 69.0 in | Wt 213.4 lb

## 2022-09-28 DIAGNOSIS — E119 Type 2 diabetes mellitus without complications: Secondary | ICD-10-CM

## 2022-09-28 DIAGNOSIS — E782 Mixed hyperlipidemia: Secondary | ICD-10-CM

## 2022-09-28 DIAGNOSIS — I10 Essential (primary) hypertension: Secondary | ICD-10-CM

## 2022-09-28 DIAGNOSIS — E669 Obesity, unspecified: Secondary | ICD-10-CM

## 2022-09-28 LAB — POCT GLYCOSYLATED HEMOGLOBIN (HGB A1C): HbA1c, POC (controlled diabetic range): 7.3 % — AB (ref 0.0–7.0)

## 2022-09-29 LAB — CBC WITH DIFFERENTIAL/PLATELET
Basophils Absolute: 0 10*3/uL (ref 0.0–0.2)
Basos: 0 %
EOS (ABSOLUTE): 0.2 10*3/uL (ref 0.0–0.4)
Eos: 2 %
Hematocrit: 51.5 % — ABNORMAL HIGH (ref 37.5–51.0)
Hemoglobin: 17.2 g/dL (ref 13.0–17.7)
Immature Grans (Abs): 0 10*3/uL (ref 0.0–0.1)
Immature Granulocytes: 0 %
Lymphocytes Absolute: 3.7 10*3/uL — ABNORMAL HIGH (ref 0.7–3.1)
Lymphs: 37 %
MCH: 29.9 pg (ref 26.6–33.0)
MCHC: 33.4 g/dL (ref 31.5–35.7)
MCV: 90 fL (ref 79–97)
Monocytes Absolute: 0.8 10*3/uL (ref 0.1–0.9)
Monocytes: 8 %
Neutrophils Absolute: 5.2 10*3/uL (ref 1.4–7.0)
Neutrophils: 53 %
Platelets: 273 10*3/uL (ref 150–450)
RBC: 5.75 x10E6/uL (ref 4.14–5.80)
RDW: 12.7 % (ref 11.6–15.4)
WBC: 10 10*3/uL (ref 3.4–10.8)

## 2022-09-29 LAB — CMP14+EGFR
ALT: 20 IU/L (ref 0–44)
AST: 12 IU/L (ref 0–40)
Albumin/Globulin Ratio: 1.7 (ref 1.2–2.2)
Albumin: 4.7 g/dL (ref 4.1–5.1)
Alkaline Phosphatase: 85 IU/L (ref 44–121)
BUN/Creatinine Ratio: 13 (ref 9–20)
BUN: 10 mg/dL (ref 6–24)
Bilirubin Total: 0.4 mg/dL (ref 0.0–1.2)
CO2: 18 mmol/L — ABNORMAL LOW (ref 20–29)
Calcium: 10 mg/dL (ref 8.7–10.2)
Chloride: 102 mmol/L (ref 96–106)
Creatinine, Ser: 0.77 mg/dL (ref 0.76–1.27)
Globulin, Total: 2.7 g/dL (ref 1.5–4.5)
Glucose: 127 mg/dL — ABNORMAL HIGH (ref 70–99)
Potassium: 4.5 mmol/L (ref 3.5–5.2)
Sodium: 138 mmol/L (ref 134–144)
Total Protein: 7.4 g/dL (ref 6.0–8.5)
eGFR: 113 mL/min/{1.73_m2} (ref >59)

## 2022-09-29 LAB — MICROALBUMIN / CREATININE URINE RATIO
Creatinine, Urine: 142 mg/dL
Microalb/Creat Ratio: 161 mg/g creat — ABNORMAL HIGH (ref 0–29)
Microalbumin, Urine: 228 ug/mL

## 2022-09-29 LAB — LIPID PANEL
Chol/HDL Ratio: 3.9 ratio (ref 0.0–5.0)
Cholesterol, Total: 134 mg/dL (ref 100–199)
HDL: 34 mg/dL — ABNORMAL LOW (ref >39)
LDL Chol Calc (NIH): 55 mg/dL (ref 0–99)
Triglycerides: 291 mg/dL — ABNORMAL HIGH (ref 0–149)
VLDL Cholesterol Cal: 45 mg/dL — ABNORMAL HIGH (ref 5–40)

## 2022-09-30 ENCOUNTER — Other Ambulatory Visit (INDEPENDENT_AMBULATORY_CARE_PROVIDER_SITE_OTHER): Payer: Self-pay | Admitting: Primary Care

## 2022-09-30 DIAGNOSIS — R809 Proteinuria, unspecified: Secondary | ICD-10-CM

## 2022-09-30 NOTE — Progress Notes (Signed)
Subjective:  Patient ID: Frederick Cervantes, male    DOB: 05/07/1976  Age: 47 y.o. MRN: [id number]  CC: DM/HTN   HPI Frederick Cervantes presents forFollow-up of diabetes. Patient does not check blood sugar at home  Compliant with meds - No Checking CBGs? No  Fasting avg -   Postprandial average -  Exercising regularly? - Yes Watching carbohydrate intake? - Yes Neuropathy ? - No Hypoglycemic events - No  - Recovers with :   Pertinent ROS:  Polyuria - No Polydipsia - No Vision problems - No  Medications as noted below. Taking them regularly without complication/adverse reaction being reported today.   History Frederick Cervantes has a past medical history of Diabetes mellitus without complication ([place]), Hypertension, and Ulcer, stomach peptic.   Frederick Cervantes has a past surgical history that includes No past surgeries.   His family history is not on file.Frederick Cervantes reports that Frederick Cervantes has been smoking cigarettes. Frederick Cervantes has been smoking an average of 1 pack per day. Frederick Cervantes has never used smokeless tobacco. Frederick Cervantes reports that Frederick Cervantes does not drink alcohol and does not use drugs.  Current Outpatient Medications on File Prior to Visit  Medication Sig Dispense Refill   aspirin EC 81 MG tablet Take 81 mg by mouth daily as needed ("for hypertension"). (Patient not taking: Reported on 09/10/2021)     Blood Glucose Monitoring Suppl (TRUE METRIX METER) w/Device KIT Use to check blood sugar once daily. (Patient not taking: Reported on 09/10/2021) 1 kit 0   Dulaglutide (TRULICITY) 1.5 EH/2.0NO SOPN Inject 1.5 mg into the skin once a week. 2 mL 4   glucose blood (TRUE METRIX BLOOD GLUCOSE TEST) test strip Use to check blood sugar once daily. 100 each 2   ibuprofen (ADVIL) 800 MG tablet Take 1 tablet (800 mg total) by mouth every 8 (eight) hours as needed. 90 tablet 0   lisinopril (ZESTRIL) 10 MG tablet Take 1 tablet (10 mg total) by mouth daily. 90 tablet 1   metFORMIN (GLUCOPHAGE-XR) 500 MG 24 hr tablet Take 2 tablets (1,000 mg total) by mouth  daily with breakfast. 180 tablet 2   pantoprazole (PROTONIX) 40 MG tablet Take 1 tablet (40 mg total) by mouth daily. 30 tablet 3   rosuvastatin (CRESTOR) 40 MG tablet Take 1 tablet (40 mg total) by mouth daily. 90 tablet 1   TRUEplus Lancets 28G MISC Use to check blood sugar once daily. 100 each 2   No current facility-administered medications on file prior to visit.    ROS Comprehensive ROS Pertinent positive and negative noted in HPI    Objective:  Blood Pressure 133/88   Pulse 83   Respiration 16   Height 5\' 9"  (1.753 m)   Weight 213 lb 6.4 oz (96.8 kg)   Oxygen Saturation 97%   Body Mass Index 31.51 kg/m   BP Readings from Last 3 Encounters:  09/28/22 133/88  06/23/22 126/81  03/17/22 132/79    Wt Readings from Last 3 Encounters:  09/28/22 213 lb 6.4 oz (96.8 kg)  06/23/22 218 lb 9.6 oz (99.2 kg)  03/17/22 219 lb 3.2 oz (99.4 kg)    Physical Exam  Lab Results  Component Value Date   HGBA1C 7.3 (A) 09/28/2022   HGBA1C 8.0 (A) 06/23/2022   HGBA1C 7.6 (A) 03/17/2022    Lab Results  Component Value Date   WBC 10.0 09/28/2022   HGB 17.2 09/28/2022   HCT 51.5 (H) 09/28/2022   PLT 273 09/28/2022   GLUCOSE  127 (H) 09/28/2022   CHOL 134 09/28/2022   TRIG 291 (H) 09/28/2022   HDL 34 (L) 09/28/2022   LDLCALC 55 09/28/2022   ALT 20 09/28/2022   AST 12 09/28/2022   NA 138 09/28/2022   K 4.5 09/28/2022   CL 102 09/28/2022   CREATININE 0.77 09/28/2022   BUN 10 09/28/2022   CO2 18 (L) 09/28/2022   INR 1.04 11/25/2017   HGBA1C 7.3 (A) 09/28/2022     Assessment & Plan:   Diagnoses and all orders for this visit:  Type 2 diabetes mellitus without complication, unspecified whether long term insulin use (HCC) -     POCT glycosylated hemoglobin (Hb A1C) 7.3 down from 8.0 - educated on lifestyle modifications, including but not limited to diet choices and adding exercise to daily routine.   -     CBC with Differential -     Ambulatory referral to  Ophthalmology  Comprehensive diabetic foot examination, type 2 DM, encounter for Frederick Cervantes) -   completed   Essential hypertension BP goal - < 130/80 close to goal  Explained that having normal blood pressure is the goal and medications are helping to get to goal and maintain normal blood pressure. DIET: Limit salt intake, read nutrition labels to check salt content, limit fried and high fatty foods  Avoid using multisymptom OTC cold preparations that generally contain sudafed which can rise BP. Consult with pharmacist on best cold relief products to use for persons with HTN EXERCISE Discussed incorporating exercise such as walking - 30 minutes most days of the week and can do in 10 minute intervals    -     CMP14+EGFR  Mildly obese Obesity is 30-39 indicating an excess in caloric intake or underlining conditions. This may lead to other co-morbidities. Educated on lifestyle modifications of diet and exercise which may reduce obesity.    Mixed hyperlipidemia Prescribed atorvastatin reviewing med list not taking  Healthy lifestyle diet of fruits vegetables fish nuts whole grains and low saturated fat . Foods high in cholesterol or liver, fatty meats,cheese, butter avocados, nuts and seeds, chocolate and fried foods.  -     Lipid Panel   I am having Frederick Cervantes maintain his aspirin EC, True Metrix Meter, TRUEplus Lancets 28G, rosuvastatin, pantoprazole, metFORMIN, True Metrix Blood Glucose Test, lisinopril, Trulicity, and ibuprofen.  Follow-up:   Return in about 3 months (around 12/28/2022) for DM/HTN.  The above assessment and management plan was discussed with the patient. The patient verbalized understanding of and has agreed to the management plan. Patient is aware to call the clinic if symptoms fail to improve or worsen. Patient is aware when to return to the clinic for a follow-up visit. Patient educated on when it is appropriate to go to the emergency department.   Juluis Mire, NP-C

## 2022-10-04 ENCOUNTER — Other Ambulatory Visit (INDEPENDENT_AMBULATORY_CARE_PROVIDER_SITE_OTHER): Payer: Self-pay | Admitting: Primary Care

## 2022-10-04 ENCOUNTER — Other Ambulatory Visit: Payer: Self-pay

## 2022-10-04 MED ORDER — ROSUVASTATIN CALCIUM 40 MG PO TABS
40.0000 mg | ORAL_TABLET | Freq: Every day | ORAL | 1 refills | Status: DC
  Filled 2022-10-04: qty 90, 90d supply, fill #0
  Filled 2022-10-12: qty 30, 30d supply, fill #0
  Filled 2022-11-08 (×2): qty 30, 30d supply, fill #1

## 2022-10-05 ENCOUNTER — Other Ambulatory Visit: Payer: Self-pay

## 2022-10-07 ENCOUNTER — Encounter: Payer: Self-pay | Admitting: Urology

## 2022-10-12 ENCOUNTER — Encounter (INDEPENDENT_AMBULATORY_CARE_PROVIDER_SITE_OTHER): Payer: Self-pay

## 2022-10-12 ENCOUNTER — Other Ambulatory Visit: Payer: Self-pay

## 2022-11-01 ENCOUNTER — Other Ambulatory Visit (INDEPENDENT_AMBULATORY_CARE_PROVIDER_SITE_OTHER): Payer: Self-pay | Admitting: Primary Care

## 2022-11-01 ENCOUNTER — Other Ambulatory Visit: Payer: Self-pay

## 2022-11-01 DIAGNOSIS — K2100 Gastro-esophageal reflux disease with esophagitis, without bleeding: Secondary | ICD-10-CM

## 2022-11-02 ENCOUNTER — Other Ambulatory Visit: Payer: Self-pay

## 2022-11-02 MED ORDER — PANTOPRAZOLE SODIUM 40 MG PO TBEC
40.0000 mg | DELAYED_RELEASE_TABLET | Freq: Every day | ORAL | 0 refills | Status: DC
  Filled 2022-11-02: qty 30, 30d supply, fill #0

## 2022-11-02 NOTE — Telephone Encounter (Signed)
Requested Prescriptions  Pending Prescriptions Disp Refills   pantoprazole (PROTONIX) 40 MG tablet 90 tablet 0    Sig: Take 1 tablet (40 mg total) by mouth daily.     Gastroenterology: Proton Pump Inhibitors Passed - 11/01/2022 11:37 AM      Passed - Valid encounter within last 12 months    Recent Outpatient Visits           1 month ago Type 2 diabetes mellitus without complication, unspecified whether long term insulin use ([place])   [place] Kerin Perna, NP   4 months ago Type 2 diabetes mellitus without complication, unspecified whether long term insulin use ([place])   [place] Kerin Perna, NP   7 months ago Type 2 diabetes mellitus without complication, unspecified whether long term insulin use ([place])   [place], Michelle P, NP   10 months ago Type 2 diabetes mellitus without complication, unspecified whether long term insulin use ([place])   [place], Michelle P, NP   1 year ago Type 2 diabetes mellitus without complication, unspecified whether long term insulin use ([place])   [place], [place], NP       Future Appointments             In 1 month Oletta Lamas, [place]ford Cage, NP [place]

## 2022-11-05 ENCOUNTER — Other Ambulatory Visit: Payer: Self-pay

## 2022-11-08 ENCOUNTER — Other Ambulatory Visit: Payer: Self-pay

## 2022-11-15 ENCOUNTER — Other Ambulatory Visit: Payer: Self-pay

## 2022-12-28 ENCOUNTER — Ambulatory Visit (INDEPENDENT_AMBULATORY_CARE_PROVIDER_SITE_OTHER): Payer: Self-pay | Admitting: Primary Care

## 2022-12-29 ENCOUNTER — Other Ambulatory Visit: Payer: Self-pay

## 2022-12-29 ENCOUNTER — Encounter (INDEPENDENT_AMBULATORY_CARE_PROVIDER_SITE_OTHER): Payer: Self-pay | Admitting: Primary Care

## 2022-12-29 ENCOUNTER — Ambulatory Visit (INDEPENDENT_AMBULATORY_CARE_PROVIDER_SITE_OTHER): Payer: Medicaid Other | Admitting: Primary Care

## 2022-12-29 VITALS — BP 126/80 | HR 81 | Resp 16 | Ht 69.0 in | Wt 215.4 lb

## 2022-12-29 DIAGNOSIS — I10 Essential (primary) hypertension: Secondary | ICD-10-CM

## 2022-12-29 DIAGNOSIS — Z23 Encounter for immunization: Secondary | ICD-10-CM

## 2022-12-29 DIAGNOSIS — E119 Type 2 diabetes mellitus without complications: Secondary | ICD-10-CM

## 2022-12-29 DIAGNOSIS — Z760 Encounter for issue of repeat prescription: Secondary | ICD-10-CM

## 2022-12-29 DIAGNOSIS — K2100 Gastro-esophageal reflux disease with esophagitis, without bleeding: Secondary | ICD-10-CM | POA: Diagnosis not present

## 2022-12-29 LAB — POCT GLYCOSYLATED HEMOGLOBIN (HGB A1C): HbA1c, POC (controlled diabetic range): 7.5 % — AB (ref 0.0–7.0)

## 2022-12-29 MED ORDER — TRULICITY 1.5 MG/0.5ML ~~LOC~~ SOAJ
1.5000 mg | SUBCUTANEOUS | 4 refills | Status: DC
Start: 2022-12-29 — End: 2023-04-06
  Filled 2022-12-29: qty 2, 28d supply, fill #0
  Filled 2023-02-02: qty 2, 28d supply, fill #1
  Filled 2023-02-25: qty 2, 28d supply, fill #2

## 2022-12-29 MED ORDER — PANTOPRAZOLE SODIUM 40 MG PO TBEC
40.0000 mg | DELAYED_RELEASE_TABLET | Freq: Every day | ORAL | 0 refills | Status: DC
Start: 2022-12-29 — End: 2023-10-17
  Filled 2022-12-29: qty 90, 90d supply, fill #0

## 2022-12-29 MED ORDER — METFORMIN HCL ER 500 MG PO TB24
1000.0000 mg | ORAL_TABLET | Freq: Every day | ORAL | 2 refills | Status: DC
  Filled 2022-12-29: qty 180, 90d supply, fill #0

## 2022-12-29 MED ORDER — LISINOPRIL 10 MG PO TABS
10.0000 mg | ORAL_TABLET | Freq: Every day | ORAL | 1 refills | Status: DC
  Filled 2022-12-29: qty 90, 90d supply, fill #0

## 2022-12-29 MED ORDER — ROSUVASTATIN CALCIUM 40 MG PO TABS
40.0000 mg | ORAL_TABLET | Freq: Every day | ORAL | 1 refills | Status: DC
  Filled 2022-12-29: qty 90, 90d supply, fill #0

## 2022-12-30 LAB — LIPID PANEL
Chol/HDL Ratio: 6.4 ratio — ABNORMAL HIGH (ref 0.0–5.0)
Cholesterol, Total: 232 mg/dL — ABNORMAL HIGH (ref 100–199)
HDL: 36 mg/dL — ABNORMAL LOW (ref >39)
LDL Chol Calc (NIH): 143 mg/dL — ABNORMAL HIGH (ref 0–99)
Triglycerides: 292 mg/dL — ABNORMAL HIGH (ref 0–149)
VLDL Cholesterol Cal: 53 mg/dL — ABNORMAL HIGH (ref 5–40)

## 2023-01-02 NOTE — Progress Notes (Signed)
[facility]  Kaleo Condrey, is a 47 y.o. male  ZOX:[id number]  [id number]  DOB - [birth year]  Chief Complaint  Patient presents with   Diabetes   Hypertension       Subjective:   Frederick Cervantes is a 47 y.o. male here today for a follow up visit for HTN. Patient has No headache, No chest pain, No abdominal pain - No Nausea, No new weakness tingling or numbness, No Cough - shortness of breath. Management of diabetes - polyuria, polyphagia, polydipsia or vision changes.  No problems updated.  No Known Allergies  Past Medical History:  Diagnosis Date   Diabetes mellitus without complication (HCC)    Hypertension    Ulcer, stomach peptic     Current Outpatient Medications on File Prior to Visit  Medication Sig Dispense Refill   aspirin EC 81 MG tablet Take 81 mg by mouth daily as needed ("for hypertension"). (Patient not taking: Reported on 09/10/2021)     Blood Glucose Monitoring Suppl (TRUE METRIX METER) w/Device KIT Use to check blood sugar once daily. (Patient not taking: Reported on 09/10/2021) 1 kit 0   glucose blood (TRUE METRIX BLOOD GLUCOSE TEST) test strip Use to check blood sugar once daily. 100 each 2   ibuprofen (ADVIL) 800 MG tablet Take 1 tablet (800 mg total) by mouth every 8 (eight) hours as needed. 90 tablet 0   TRUEplus Lancets 28G MISC Use to check blood sugar once daily. 100 each 2   No current facility-administered medications on file prior to visit.    Objective:   Vitals:   12/29/22 1017  BP: 126/80  Pulse: 81  Resp: 16  SpO2: 99%  Weight: 215 lb 6.4 oz (97.7 kg)  Height: 5\' 9"  (1.753 m)    Comprehensive ROS Pertinent positive and negative noted in HPI   Exam General appearance : Awake, alert, not in any distress. Speech Clear. Not toxic looking HEENT: Atraumatic and Normocephalic, pupils equally reactive to light and accomodation Neck: Supple, no JVD. No cervical lymphadenopathy.  Chest: Good air entry bilaterally,  no added sounds  CVS: S1 S2 regular, no murmurs.  Abdomen: Bowel sounds present, Non tender and not distended with no gaurding, rigidity or rebound. Extremities: B/L Lower Ext shows no edema, both legs are warm to touch Neurology: Awake alert, and oriented X 3, CN II-XII intact, Non focal Skin: No Rash  Data Review Lab Results  Component Value Date   HGBA1C 7.5 (A) 12/29/2022   HGBA1C 7.3 (A) 09/28/2022   HGBA1C 8.0 (A) 06/23/2022   Frederick Cervantes was seen today for diabetes and hypertension.  Diagnoses and all orders for this visit:  Type 2 diabetes mellitus without complication, unspecified whether long term insulin use (HCC) - educated on lifestyle modifications, including but not limited to diet choices and adding exercise to daily routine.   -     POCT glycosylated hemoglobin (Hb A1C) 7.5 -     lisinopril (ZESTRIL) 10 MG tablet; Take 1 tablet (10 mg total) by mouth daily. -     Dulaglutide (TRULICITY) 1.5 MG/0.5ML SOPN; Inject 1.5 mg into the skin once a week. -     Lipid Panel  Medication refill -     lisinopril (ZESTRIL) 10 MG tablet; Take 1 tablet (10 mg total) by mouth daily. -     Dulaglutide (TRULICITY) 1.5 MG/0.5ML SOPN; Inject 1.5 mg into the skin once a week.  Essential hypertension Well control on ACE and CVD  BP  goal - < 130/80 Explained that having normal blood pressure is the goal and medications are helping to get to goal and maintain normal blood pressure. DIET: Limit salt intake, read nutrition labels to check salt content, limit fried and high fatty foods  Avoid using multisymptom OTC cold preparations that generally contain sudafed which can rise BP. Consult with pharmacist on best cold relief products to use for persons with HTN EXERCISE Discussed incorporating exercise such as walking - 30 minutes most days of the week and can do in 10 minute intervals    -     lisinopril (ZESTRIL) 10 MG tablet; Take 1 tablet (10 mg total) by mouth daily.  Gastroesophageal reflux  disease with esophagitis without hemorrhage Discussed eating small frequent meal, reduction in acidic foods, fried foods ,spicy foods, alcohol caffeine and tobacco and certain medications. Avoid laying down after eating 4mins-1hour, elevated head of the bed.  -     pantoprazole (PROTONIX) 40 MG tablet; Take 1 tablet (40 mg total) by mouth daily.  Other orders -     metFORMIN (GLUCOPHAGE-XR) 500 MG 24 hr tablet; Take 2 tablets (1,000 mg total) by mouth daily with breakfast. -     rosuvastatin (CRESTOR) 40 MG tablet; Take 1 tablet (40 mg total) by mouth daily. -     Tdap vaccine greater than or equal to 7yo IM     Patient have been counseled extensively about nutrition and exercise. Other issues discussed during this visit include: low cholesterol diet, weight control and daily exercise, foot care, annual eye examinations at Ophthalmology, importance of adherence with medications and regular follow-up. We also discussed long term complications of uncontrolled diabetes and hypertension.   Return in about 3 months (around 03/30/2023) for DM/HTN.  The patient was given clear instructions to go to ER or return to medical center if symptoms don't improve, worsen or new problems develop. The patient verbalized understanding. The patient was told to call to get lab results if they haven't heard anything in the next week.   This note has been created with Education officer, environmental. Any transcriptional errors are unintentional.   Grayce Sessions, NP 01/02/2023, 11:20 PM

## 2023-02-02 ENCOUNTER — Other Ambulatory Visit: Payer: Self-pay

## 2023-02-15 ENCOUNTER — Encounter (HOSPITAL_COMMUNITY): Payer: Self-pay

## 2023-02-15 ENCOUNTER — Emergency Department (HOSPITAL_COMMUNITY): Payer: Medicaid Other

## 2023-02-15 ENCOUNTER — Emergency Department (HOSPITAL_COMMUNITY)
Admission: EM | Admit: 2023-02-15 | Discharge: 2023-02-15 | Disposition: A | Discharge status: Home / Self Care | Payer: Medicaid Other | Attending: Emergency Medicine | Admitting: Emergency Medicine

## 2023-02-15 DIAGNOSIS — E1165 Type 2 diabetes mellitus with hyperglycemia: Secondary | ICD-10-CM | POA: Insufficient documentation

## 2023-02-15 DIAGNOSIS — R0789 Other chest pain: Secondary | ICD-10-CM | POA: Insufficient documentation

## 2023-02-15 DIAGNOSIS — M25512 Pain in left shoulder: Secondary | ICD-10-CM | POA: Insufficient documentation

## 2023-02-15 DIAGNOSIS — S060X0A Concussion without loss of consciousness, initial encounter: Secondary | ICD-10-CM | POA: Diagnosis not present

## 2023-02-15 DIAGNOSIS — Z79899 Other long term (current) drug therapy: Secondary | ICD-10-CM | POA: Diagnosis not present

## 2023-02-15 DIAGNOSIS — S0990XA Unspecified injury of head, initial encounter: Secondary | ICD-10-CM | POA: Diagnosis present

## 2023-02-15 DIAGNOSIS — S161XXA Strain of muscle, fascia and tendon at neck level, initial encounter: Secondary | ICD-10-CM | POA: Diagnosis not present

## 2023-02-15 DIAGNOSIS — Y92410 Unspecified street and highway as the place of occurrence of the external cause: Secondary | ICD-10-CM | POA: Insufficient documentation

## 2023-02-15 DIAGNOSIS — M25511 Pain in right shoulder: Secondary | ICD-10-CM | POA: Diagnosis not present

## 2023-02-15 LAB — CBG MONITORING, ED: Glucose-Capillary: 131 mg/dL — ABNORMAL HIGH (ref 70–99)

## 2023-02-15 MED ORDER — ACETAMINOPHEN 500 MG PO TABS
1000.0000 mg | ORAL_TABLET | Freq: Once | ORAL | Status: AC
  Administered 2023-02-15: 1000 mg via ORAL
  Filled 2023-02-15: qty 2

## 2023-02-15 MED ORDER — CYCLOBENZAPRINE HCL 10 MG PO TABS: 10.0000 mg | ORAL_TABLET | Freq: Two times a day (BID) | ORAL | 0 refills | Status: DC | PRN

## 2023-02-15 MED ORDER — ONDANSETRON 4 MG PO TBDP
4.0000 mg | ORAL_TABLET | Freq: Once | ORAL | Status: AC
  Administered 2023-02-15: 4 mg via ORAL
  Filled 2023-02-15: qty 1

## 2023-02-15 MED ORDER — ONDANSETRON HCL 4 MG PO TABS: 4.0000 mg | ORAL_TABLET | Freq: Four times a day (QID) | ORAL | 0 refills | Status: AC | PRN

## 2023-02-15 MED ORDER — CYCLOBENZAPRINE HCL 10 MG PO TABS
10.0000 mg | ORAL_TABLET | Freq: Once | ORAL | Status: AC
  Administered 2023-02-15: 10 mg via ORAL
  Filled 2023-02-15: qty 1

## 2023-02-15 MED ORDER — IBUPROFEN 200 MG PO TABS
600.0000 mg | ORAL_TABLET | Freq: Once | ORAL | Status: DC
  Filled 2023-02-15: qty 3
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
## 2023-02-15 NOTE — ED Notes (Signed)
With radiology
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
## 2023-02-24 ENCOUNTER — Ambulatory Visit: Payer: Medicaid Other | Admitting: Family Medicine

## 2023-02-24 ENCOUNTER — Other Ambulatory Visit: Payer: Self-pay

## 2023-02-24 VITALS — BP 142/78 | HR 81 | Ht 71.0 in | Wt 218.0 lb

## 2023-02-24 DIAGNOSIS — M542 Cervicalgia: Secondary | ICD-10-CM

## 2023-02-24 DIAGNOSIS — S060X0A Concussion without loss of consciousness, initial encounter: Secondary | ICD-10-CM

## 2023-02-24 MED ORDER — TIZANIDINE HCL 4 MG PO TABS
2.0000 mg | ORAL_TABLET | Freq: Three times a day (TID) | ORAL | 1 refills | Status: AC | PRN
  Filled 2023-02-24: qty 30, 10d supply, fill #0

## 2023-02-24 NOTE — Patient Instructions (Addendum)
Thank you for coming in today.   Plan for physical therapy.   Take breaks for vision.   Recheck in about 2 weeks.   Let me know sooner if this is not working.   Heating pad could help.   TENS unit could help.   TENS UNIT: This is helpful for muscle pain and spasm.   Search and Purchase a TENS 7000 2nd edition at  www.tenspros.com or www.Amazon.com It should be less than $30.     TENS unit instructions: Do not shower or bathe with the unit on Turn the unit off before removing electrodes or batteries If the electrodes lose stickiness add a drop of water to the electrodes after they are disconnected from the unit and place on plastic sheet. If you continued to have difficulty, call the TENS unit company to purchase more electrodes. Do not apply lotion on the skin area prior to use. Make sure the skin is clean and dry as this will help prolong the life of the electrodes. After use, always check skin for unusual red areas, rash or other skin difficulties. If there are any skin problems, does not apply electrodes to the same area. Never remove the electrodes from the unit by pulling the wires. Do not use the TENS unit or electrodes other than as directed. Do not change electrode placement without consultating your therapist or physician. Keep 2 fingers with between each electrode. Wear time ratio is 2:1, on to off times.    For example on for 30 minutes off for 15 minutes and then on for 30 minutes off for 15 minutes

## 2023-02-25 ENCOUNTER — Other Ambulatory Visit: Payer: Self-pay

## 2023-03-09 ENCOUNTER — Encounter: Payer: Medicaid Other | Admitting: Family Medicine
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
## 2023-03-29 ENCOUNTER — Ambulatory Visit: Payer: Medicaid Other

## 2023-04-06 ENCOUNTER — Other Ambulatory Visit: Payer: Self-pay

## 2023-04-06 ENCOUNTER — Ambulatory Visit (INDEPENDENT_AMBULATORY_CARE_PROVIDER_SITE_OTHER): Payer: Medicaid Other | Admitting: Primary Care

## 2023-04-06 VITALS — BP 123/80 | HR 89 | Resp 16 | Wt 215.8 lb

## 2023-04-06 DIAGNOSIS — I10 Essential (primary) hypertension: Secondary | ICD-10-CM

## 2023-04-06 DIAGNOSIS — Z6830 Body mass index (BMI) 30.0-30.9, adult: Secondary | ICD-10-CM

## 2023-04-06 DIAGNOSIS — E669 Obesity, unspecified: Secondary | ICD-10-CM | POA: Diagnosis not present

## 2023-04-06 DIAGNOSIS — Z7985 Long-term (current) use of injectable non-insulin antidiabetic drugs: Secondary | ICD-10-CM

## 2023-04-06 DIAGNOSIS — Z760 Encounter for issue of repeat prescription: Secondary | ICD-10-CM

## 2023-04-06 DIAGNOSIS — Z7984 Long term (current) use of oral hypoglycemic drugs: Secondary | ICD-10-CM | POA: Diagnosis not present

## 2023-04-06 DIAGNOSIS — E782 Mixed hyperlipidemia: Secondary | ICD-10-CM | POA: Diagnosis not present

## 2023-04-06 DIAGNOSIS — E119 Type 2 diabetes mellitus without complications: Secondary | ICD-10-CM | POA: Diagnosis not present

## 2023-04-06 LAB — POCT GLYCOSYLATED HEMOGLOBIN (HGB A1C): HbA1c, POC (controlled diabetic range): 7.6 % — AB (ref 0.0–7.0)

## 2023-04-06 MED ORDER — METFORMIN HCL ER 500 MG PO TB24
1000.0000 mg | ORAL_TABLET | Freq: Every day | ORAL | 2 refills | Status: DC
  Filled 2023-04-06: qty 180, 90d supply, fill #0
  Filled 2023-07-22: qty 180, 90d supply, fill #1
  Filled 2023-10-17: qty 180, 90d supply, fill #2

## 2023-04-06 MED ORDER — LISINOPRIL 10 MG PO TABS
10.0000 mg | ORAL_TABLET | Freq: Every day | ORAL | 1 refills | Status: DC
Start: 2023-04-06 — End: 2024-02-23
  Filled 2023-04-06: qty 90, 90d supply, fill #0
  Filled 2023-07-22: qty 90, 90d supply, fill #1

## 2023-04-06 MED ORDER — TRULICITY 1.5 MG/0.5ML ~~LOC~~ SOAJ
1.5000 mg | SUBCUTANEOUS | 4 refills | Status: DC
Start: 2023-04-06 — End: 2024-02-23
  Filled 2023-04-06: qty 2, 28d supply, fill #0
  Filled 2023-06-02: qty 2, 28d supply, fill #1
  Filled 2023-07-22: qty 2, 28d supply, fill #2
  Filled 2023-10-17: qty 2, 28d supply, fill #3
  Filled 2023-11-22: qty 2, 28d supply, fill #4

## 2023-04-06 NOTE — Progress Notes (Signed)
Subjective:  Patient ID: Frederick Cervantes, male    DOB: [birth year]  Age: 47 y.o. MRN: [id number]  CC: Diabetes   HPI Frederick Cervantes presents forFollow-up of diabetes. Patient does not check blood sugar at home. Bp unremarkable - Patient has No headache, No chest pain, No abdominal pain - No Nausea, No new weakness tingling or numbness, No Cough - shortness of breath   Compliant with meds - Yes Checking CBGs? Yes  Fasting avg - 120-130  Postprandial average -  Exercising regularly? - No Watching carbohydrate intake? - Yes Neuropathy ? - No Hypoglycemic events - No  - Recovers with :   Pertinent ROS:  Polyuria - No Polydipsia - No Vision problems - No  Medications as noted below. Taking them regularly without complication/adverse reaction being reported today.   History Frederick Cervantes has a past medical history of Diabetes mellitus without complication (HCC), Hypertension, and Ulcer, stomach peptic.   He has a past surgical history that includes No past surgeries.   His family history is not on file.He reports that he has been smoking cigarettes. He has never used smokeless tobacco. He reports that he does not drink alcohol and does not use drugs.  Current Outpatient Medications on File Prior to Visit  Medication Sig Dispense Refill   aspirin EC 81 MG tablet Take 81 mg by mouth daily as needed ("for hypertension"). (Patient not taking: Reported on 09/10/2021)     Blood Glucose Monitoring Suppl (TRUE METRIX METER) w/Device KIT Use to check blood sugar once daily. (Patient not taking: Reported on 09/10/2021) 1 kit 0   glucose blood (TRUE METRIX BLOOD GLUCOSE TEST) test strip Use to check blood sugar once daily. 100 each 2   ibuprofen (ADVIL) 800 MG tablet Take 1 tablet (800 mg total) by mouth every 8 (eight) hours as needed. 90 tablet 0   ondansetron (ZOFRAN) 4 MG tablet Take 1 tablet (4 mg total) by mouth every 6 (six) hours as needed for nausea or vomiting. 12 tablet 0   pantoprazole  (PROTONIX) 40 MG tablet Take 1 tablet (40 mg total) by mouth daily. 90 tablet 0   rosuvastatin (CRESTOR) 40 MG tablet Take 1 tablet (40 mg total) by mouth daily. 90 tablet 1   tiZANidine (ZANAFLEX) 4 MG tablet Take 0.5-1 tablets (2-4 mg total) by mouth every 8 (eight) hours as needed for muscle spasms. 30 tablet 1   TRUEplus Lancets 28G MISC Use to check blood sugar once daily. 100 each 2   No current facility-administered medications on file prior to visit.    ROS Comprehensive ROS Pertinent positive and negative noted in HPI    Objective:  Blood Pressure 123/80   Pulse 89   Respiration 16   Weight 215 lb 12.8 oz (97.9 kg)   Oxygen Saturation 97%   Body Mass Index 30.10 kg/m   BP Readings from Last 3 Encounters:  04/06/23 123/80  02/24/23 (Abnormal) 142/78  02/15/23 118/77    Wt Readings from Last 3 Encounters:  04/06/23 215 lb 12.8 oz (97.9 kg)  02/24/23 218 lb (98.9 kg)  02/15/23 200 lb (90.7 kg)    Physical Exam Vitals reviewed.  Constitutional:      Appearance: He is obese.  HENT:     Right Ear: External ear normal.     Left Ear: External ear normal.  Eyes:     Extraocular Movements: Extraocular movements intact.  Cardiovascular:     Rate and Rhythm: Normal rate and  regular rhythm.  Pulmonary:     Effort: Pulmonary effort is normal.     Breath sounds: Normal breath sounds.  Abdominal:     General: Bowel sounds are normal. There is distension.     Palpations: Abdomen is soft.  Musculoskeletal:        General: Normal range of motion.     Cervical back: Normal range of motion and neck supple.  Skin:    General: Skin is warm and dry.  Neurological:     Mental Status: He is oriented to person, place, and time.  Psychiatric:        Mood and Affect: Mood normal.        Behavior: Behavior normal.    Lab Results  Component Value Date   HGBA1C 7.6 (A) 04/06/2023   HGBA1C 7.5 (A) 12/29/2022   HGBA1C 7.3 (A) 09/28/2022    Lab Results  Component Value  Date   WBC 10.0 09/28/2022   HGB 17.2 09/28/2022   HCT 51.5 (H) 09/28/2022   PLT 273 09/28/2022   GLUCOSE 127 (H) 09/28/2022   CHOL 232 (H) 12/29/2022   TRIG 292 (H) 12/29/2022   HDL 36 (L) 12/29/2022   LDLCALC 143 (H) 12/29/2022   ALT 20 09/28/2022   AST 12 09/28/2022   NA 138 09/28/2022   K 4.5 09/28/2022   CL 102 09/28/2022   CREATININE 0.77 09/28/2022   BUN 10 09/28/2022   CO2 18 (L) 09/28/2022   INR 1.04 11/25/2017   HGBA1C 7.6 (A) 04/06/2023     Assessment & Plan:   Frederick Cervantes was seen today for diabetes.  Diagnoses and all orders for this visit:  Frederick Cervantes was seen today for diabetes.  Diagnoses and all orders for this visit:  Type 2 diabetes mellitus without complication, unspecified whether long term insulin use (HCC) - educated on lifestyle modifications, including but not limited to diet choices and adding exercise to daily routine.   -     POCT glycosylated hemoglobin (Hb A1C) -     Dulaglutide (TRULICITY) 1.5 MG/0.5ML SOPN; Inject 1.5 mg into the skin once a week. -     lisinopril (ZESTRIL) 10 MG tablet; Take 1 tablet (10 mg total) by mouth daily.  Medication refill -     Dulaglutide (TRULICITY) 1.5 MG/0.5ML SOPN; Inject 1.5 mg into the skin once a week. -     lisinopril (ZESTRIL) 10 MG tablet; Take 1 tablet (10 mg total) by mouth daily.  Essential hypertension Well controlled  BP goal - < 130/80symptom OTC cold preparations that generally contain sudafed which can rise BP. Consult with pharmacist on best cold relief products to use for persons with HTN EXERCISE Discussed incorporating exercise such as walking - 30 minutes most days of the week and can do in 10 minute intervals    -     lisinopril (ZESTRIL) 10 MG tablet; Take 1 tablet (10 mg total) by mouth daily.  Mixed hyperlipidemia Elevated last blood work taking statin daily repeat labs -     Lipid Panel  Other orders -     metFORMIN (GLUCOPHAGE-XR) 500 MG 24 hr tablet; Take 2 tablets (1,000 mg total) by  mouth daily with breakfast.    I am having Frederick Cervantes. Frederick Cervantes maintain his aspirin EC, True Metrix Meter, TRUEplus Lancets 28G, True Metrix Blood Glucose Test, ibuprofen, pantoprazole, rosuvastatin, ondansetron, tiZANidine, Trulicity, lisinopril, and metFORMIN.  Meds ordered this encounter  Medications   Dulaglutide (TRULICITY) 1.5 MG/0.5ML SOPN  Sig: Inject 1.5 mg into the skin once a week.    Dispense:  2 mL    Refill:  4    Order Specific Question:   Supervising Provider    Answer:   Quentin Angst [[id number]]   lisinopril (ZESTRIL) 10 MG tablet    Sig: Take 1 tablet (10 mg total) by mouth daily.    Dispense:  90 tablet    Refill:  1    Order Specific Question:   Supervising Provider    Answer:   Quentin Angst [[id number]]   metFORMIN (GLUCOPHAGE-XR) 500 MG 24 hr tablet    Sig: Take 2 tablets (1,000 mg total) by mouth daily with breakfast.    Dispense:  180 tablet    Refill:  2    Order Specific Question:   Supervising Provider    Answer:   Quentin Angst [id number]     Follow-up:   Return in about 3 months (around 07/07/2023) for DM.  The above assessment and management plan was discussed with the patient. The patient verbalized understanding of and has agreed to the management plan. Patient is aware to call the clinic if symptoms fail to improve or worsen. Patient is aware when to return to the clinic for a follow-up visit. Patient educated on when it is appropriate to go to the emergency department.   [name], NP-C

## 2023-04-06 NOTE — Patient Instructions (Signed)
Calorie Counting for Weight Loss Calories are units of energy. Your body needs a certain number of calories from food to keep going throughout the day. When you eat or drink more calories than your body needs, your body stores the extra calories mostly as fat. When you eat or drink fewer calories than your body needs, your body burns fat to get the energy it needs. Calorie counting means keeping track of how many calories you eat and drink each day. Calorie counting can be helpful if you need to lose weight. If you eat fewer calories than your body needs, you should lose weight. Ask your health care provider what a healthy weight is for you. For calorie counting to work, you will need to eat the right number of calories each day to lose a healthy amount of weight per week. A dietitian can help you figure out how many calories you need in a day and will suggest ways to reach your calorie goal. A healthy amount of weight to lose each week is usually 1-2 lb (0.5-0.9 kg). This usually means that your daily calorie intake should be reduced by 500-750 calories. Eating 1,200-1,500 calories a day can help most women lose weight. Eating 1,500-1,800 calories a day can help most men lose weight. What do I need to know about calorie counting? Work with your health care provider or dietitian to determine how many calories you should get each day. To meet your daily calorie goal, you will need to: Find out how many calories are in each food that you would like to eat. Try to do this before you eat. Decide how much of the food you plan to eat. Keep a food log. Do this by writing down what you ate and how many calories it had. To successfully lose weight, it is important to balance calorie counting with a healthy lifestyle that includes regular activity. Where do I find calorie information?  The number of calories in a food can be found on a Nutrition Facts label. If a food does not have a Nutrition Facts label, try  to look up the calories online or ask your dietitian for help. Remember that calories are listed per serving. If you choose to have more than one serving of a food, you will have to multiply the calories per serving by the number of servings you plan to eat. For example, the label on a package of bread might say that a serving size is 1 slice and that there are 90 calories in a serving. If you eat 1 slice, you will have eaten 90 calories. If you eat 2 slices, you will have eaten 180 calories. How do I keep a food log? After each time that you eat, record the following in your food log as soon as possible: What you ate. Be sure to include toppings, sauces, and other extras on the food. How much you ate. This can be measured in cups, ounces, or number of items. How many calories were in each food and drink. The total number of calories in the food you ate. Keep your food log near you, such as in a pocket-sized notebook or on an app or website on your mobile phone. Some programs will calculate calories for you and show you how many calories you have left to meet your daily goal. What are some portion-control tips? Know how many calories are in a serving. This will help you know how many servings you can have of a certain   food. Use a measuring cup to measure serving sizes. You could also try weighing out portions on a kitchen scale. With time, you will be able to estimate serving sizes for some foods. Take time to put servings of different foods on your favorite plates or in your favorite bowls and cups so you know what a serving looks like. Try not to eat straight from a food's packaging, such as from a bag or box. Eating straight from the package makes it hard to see how much you are eating and can lead to overeating. Put the amount you would like to eat in a cup or on a plate to make sure you are eating the right portion. Use smaller plates, glasses, and bowls for smaller portions and to prevent  overeating. Try not to multitask. For example, avoid watching TV or using your computer while eating. If it is time to eat, sit down at a table and enjoy your food. This will help you recognize when you are full. It will also help you be more mindful of what and how much you are eating. What are tips for following this plan? Reading food labels Check the calorie count compared with the serving size. The serving size may be smaller than what you are used to eating. Check the source of the calories. Try to choose foods that are high in protein, fiber, and vitamins, and low in saturated fat, trans fat, and sodium. Shopping Read nutrition labels while you shop. This will help you make healthy decisions about which foods to buy. Pay attention to nutrition labels for low-fat or fat-free foods. These foods sometimes have the same number of calories or more calories than the full-fat versions. They also often have added sugar, starch, or salt to make up for flavor that was removed with the fat. Make a grocery list of lower-calorie foods and stick to it. Cooking Try to cook your favorite foods in a healthier way. For example, try baking instead of frying. Use low-fat dairy products. Meal planning Use more fruits and vegetables. One-half of your plate should be fruits and vegetables. Include lean proteins, such as chicken, turkey, and fish. Lifestyle Each week, aim to do one of the following: 150 minutes of moderate exercise, such as walking. 75 minutes of vigorous exercise, such as running. General information Know how many calories are in the foods you eat most often. This will help you calculate calorie counts faster. Find a way of tracking calories that works for you. Get creative. Try different apps or programs if writing down calories does not work for you. What foods should I eat?  Eat nutritious foods. It is better to have a nutritious, high-calorie food, such as an avocado, than a food with  few nutrients, such as a bag of potato chips. Use your calories on foods and drinks that will fill you up and will not leave you hungry soon after eating. Examples of foods that fill you up are nuts and nut butters, vegetables, lean proteins, and high-fiber foods such as whole grains. High-fiber foods are foods with more than 5 g of fiber per serving. Pay attention to calories in drinks. Low-calorie drinks include water and unsweetened drinks. The items listed above may not be a complete list of foods and beverages you can eat. Contact a dietitian for more information. What foods should I limit? Limit foods or drinks that are not good sources of vitamins, minerals, or protein or that are high in unhealthy fats. These   include: Candy. Other sweets. Sodas, specialty coffee drinks, alcohol, and juice. The items listed above may not be a complete list of foods and beverages you should avoid. Contact a dietitian for more information. How do I count calories when eating out? Pay attention to portions. Often, portions are much larger when eating out. Try these tips to keep portions smaller: Consider sharing a meal instead of getting your own. If you get your own meal, eat only half of it. Before you start eating, ask for a container and put half of your meal into it. When available, consider ordering smaller portions from the menu instead of full portions. Pay attention to your food and drink choices. Knowing the way food is cooked and what is included with the meal can help you eat fewer calories. If calories are listed on the menu, choose the lower-calorie options. Choose dishes that include vegetables, fruits, whole grains, low-fat dairy products, and lean proteins. Choose items that are boiled, broiled, grilled, or steamed. Avoid items that are buttered, battered, fried, or served with cream sauce. Items labeled as crispy are usually fried, unless stated otherwise. Choose water, low-fat milk,  unsweetened iced tea, or other drinks without added sugar. If you want an alcoholic beverage, choose a lower-calorie option, such as a glass of wine or light beer. Ask for dressings, sauces, and syrups on the side. These are usually high in calories, so you should limit the amount you eat. If you want a salad, choose a garden salad and ask for grilled meats. Avoid extra toppings such as bacon, cheese, or fried items. Ask for the dressing on the side, or ask for olive oil and vinegar or lemon to use as dressing. Estimate how many servings of a food you are given. Knowing serving sizes will help you be aware of how much food you are eating at restaurants. Where to find more information Centers for Disease Control and Prevention: www.cdc.gov [place] Department of Agriculture: myplate.gov Summary Calorie counting means keeping track of how many calories you eat and drink each day. If you eat fewer calories than your body needs, you should lose weight. A healthy amount of weight to lose per week is usually 1-2 lb (0.5-0.9 kg). This usually means reducing your daily calorie intake by 500-750 calories. The number of calories in a food can be found on a Nutrition Facts label. If a food does not have a Nutrition Facts label, try to look up the calories online or ask your dietitian for help. Use smaller plates, glasses, and bowls for smaller portions and to prevent overeating. Use your calories on foods and drinks that will fill you up and not leave you hungry shortly after a meal. This information is not intended to replace advice given to you by your health care provider. Make sure you discuss any questions you have with your health care provider. Document Revised: 10/04/2019 Document Reviewed: 10/04/2019 Elsevier Patient Education  2023 [employer]  

## 2023-04-08 ENCOUNTER — Other Ambulatory Visit: Payer: Self-pay

## 2023-04-08 ENCOUNTER — Other Ambulatory Visit (INDEPENDENT_AMBULATORY_CARE_PROVIDER_SITE_OTHER): Payer: Self-pay | Admitting: Primary Care

## 2023-04-08 MED ORDER — ROSUVASTATIN CALCIUM 40 MG PO TABS
40.0000 mg | ORAL_TABLET | Freq: Every day | ORAL | 0 refills | Status: DC
  Filled 2023-04-08 – 2023-06-02 (×2): qty 90, 90d supply, fill #0

## 2023-04-14 ENCOUNTER — Other Ambulatory Visit: Payer: Self-pay

## 2023-06-02 ENCOUNTER — Other Ambulatory Visit: Payer: Self-pay

## 2023-07-22 ENCOUNTER — Other Ambulatory Visit: Payer: Self-pay

## 2023-10-17 ENCOUNTER — Other Ambulatory Visit (INDEPENDENT_AMBULATORY_CARE_PROVIDER_SITE_OTHER): Payer: Self-pay | Admitting: Primary Care

## 2023-10-17 ENCOUNTER — Other Ambulatory Visit: Payer: Self-pay

## 2023-10-17 DIAGNOSIS — I10 Essential (primary) hypertension: Secondary | ICD-10-CM

## 2023-10-17 DIAGNOSIS — K2100 Gastro-esophageal reflux disease with esophagitis, without bleeding: Secondary | ICD-10-CM

## 2023-10-17 DIAGNOSIS — E119 Type 2 diabetes mellitus without complications: Secondary | ICD-10-CM

## 2023-10-17 DIAGNOSIS — Z760 Encounter for issue of repeat prescription: Secondary | ICD-10-CM

## 2023-10-17 MED ORDER — PANTOPRAZOLE SODIUM 40 MG PO TBEC
40.0000 mg | DELAYED_RELEASE_TABLET | Freq: Every day | ORAL | 0 refills | Status: AC
  Filled 2023-10-17 – 2024-09-17 (×2): qty 90, 90d supply, fill #0

## 2023-10-17 NOTE — Telephone Encounter (Signed)
 Requested Prescriptions  Pending Prescriptions Disp Refills   pantoprazole  (PROTONIX ) 40 MG tablet 90 tablet 0    Sig: Take 1 tablet (40 mg total) by mouth daily.     Gastroenterology: Proton Pump Inhibitors Passed - 10/17/2023  4:35 PM      Passed - Valid encounter within last 12 months    Recent Outpatient Visits           6 months ago Type 2 diabetes mellitus without complication, unspecified whether long term insulin  use (HCC)   [place] [facility] Marius Siemens, NP   9 months ago Type 2 diabetes mellitus without complication, unspecified whether long term insulin  use (HCC)   [place] [facility] Marius Siemens, NP   1 year ago Type 2 diabetes mellitus without complication, unspecified whether long term insulin  use (HCC)   [place] [facility] Marius Siemens, NP   1 year ago Type 2 diabetes mellitus without complication, unspecified whether long term insulin  use (HCC)   [place] [facility] Marius Siemens, NP   1 year ago Type 2 diabetes mellitus without complication, unspecified whether long term insulin  use (HCC)   [place] [facility] Marius Siemens, NP               lisinopril  (ZESTRIL ) 10 MG tablet 90 tablet 1    Sig: Take 1 tablet (10 mg total) by mouth daily.     Cardiovascular:  ACE Inhibitors Failed - 10/17/2023  4:35 PM      Failed - Cr in normal range and within 180 days    Creatinine, Ser  Date Value Ref Range Status  09/28/2022 0.77 0.76 - 1.27 mg/dL Final         Failed - K in normal range and within 180 days    Potassium  Date Value Ref Range Status  09/28/2022 4.5 3.5 - 5.2 mmol/L Final         Failed - Valid encounter within last 6 months    Recent Outpatient Visits           6 months ago Type 2 diabetes mellitus without complication, unspecified whether long term insulin  use (HCC)   [place] [facility]  Marius Siemens, NP   9 months ago Type 2 diabetes mellitus without complication, unspecified whether long term insulin  use (HCC)   [place] [facility] Marius Siemens, NP   1 year ago Type 2 diabetes mellitus without complication, unspecified whether long term insulin  use (HCC)   [place] [facility] Marius Siemens, NP   1 year ago Type 2 diabetes mellitus without complication, unspecified whether long term insulin  use (HCC)   [place] [facility] Marius Siemens, NP   1 year ago Type 2 diabetes mellitus without complication, unspecified whether long term insulin  use (HCC)   [place] [facility] Marius Siemens, NP              Passed - Patient is not pregnant      Passed - Last BP in normal range    BP Readings from Last 1 Encounters:  04/06/23 123/80          rosuvastatin  (CRESTOR ) 40 MG tablet 90 tablet 0    Sig: Take 1 tablet (40 mg total) by mouth daily.     Cardiovascular:  Antilipid - Statins 2 Failed - 10/17/2023  4:35 PM  Failed - Cr in normal range and within 360 days    Creatinine, Ser  Date Value Ref Range Status  09/28/2022 0.77 0.76 - 1.27 mg/dL Final         Failed - Lipid Panel in normal range within the last 12 months    Cholesterol, Total  Date Value Ref Range Status  04/06/2023 120 100 - 199 mg/dL Final   LDL Chol Calc (NIH)  Date Value Ref Range Status  04/06/2023 42 0 - 99 mg/dL Final   HDL  Date Value Ref Range Status  04/06/2023 32 (L) >39 mg/dL Final   Triglycerides  Date Value Ref Range Status  04/06/2023 300 (H) 0 - 149 mg/dL Final         Passed - Patient is not pregnant      Passed - Valid encounter within last 12 months    Recent Outpatient Visits           6 months ago Type 2 diabetes mellitus without complication, unspecified whether long term insulin  use (HCC)   [place] [facility] Marius Siemens, NP    9 months ago Type 2 diabetes mellitus without complication, unspecified whether long term insulin  use (HCC)   [place] [facility] Marius Siemens, NP   1 year ago Type 2 diabetes mellitus without complication, unspecified whether long term insulin  use (HCC)   [place] [facility] Marius Siemens, NP   1 year ago Type 2 diabetes mellitus without complication, unspecified whether long term insulin  use (HCC)   [place] [facility] Marius Siemens, NP   1 year ago Type 2 diabetes mellitus without complication, unspecified whether long term insulin  use [facility])   [place] [facility] Marius Siemens, NP

## 2023-10-17 NOTE — Telephone Encounter (Signed)
 Requested medication (s) are due for refill today: Yes  Requested medication (s) are on the active medication list: Yes  Last refill:  04/06/23  Future visit scheduled: No   Notes to clinic:  Unable to refill per protocol due to failed labs, no updated results.      Requested Prescriptions  Pending Prescriptions Disp Refills   lisinopril  (ZESTRIL ) 10 MG tablet 90 tablet 1    Sig: Take 1 tablet (10 mg total) by mouth daily.     Cardiovascular:  ACE Inhibitors Failed - 10/17/2023  4:35 PM      Failed - Cr in normal range and within 180 days    Creatinine, Ser  Date Value Ref Range Status  09/28/2022 0.77 0.76 - 1.27 mg/dL Final         Failed - K in normal range and within 180 days    Potassium  Date Value Ref Range Status  09/28/2022 4.5 3.5 - 5.2 mmol/L Final         Failed - Valid encounter within last 6 months    Recent Outpatient Visits           6 months ago Type 2 diabetes mellitus without complication, unspecified whether long term insulin  use (HCC)   [place] [facility] Marius Siemens, NP   9 months ago Type 2 diabetes mellitus without complication, unspecified whether long term insulin  use (HCC)   [place] [facility] Marius Siemens, NP   1 year ago Type 2 diabetes mellitus without complication, unspecified whether long term insulin  use (HCC)   [place] [facility] Marius Siemens, NP   1 year ago Type 2 diabetes mellitus without complication, unspecified whether long term insulin  use (HCC)   [place] [facility] Marius Siemens, NP   1 year ago Type 2 diabetes mellitus without complication, unspecified whether long term insulin  use (HCC)   [place] [facility] Marius Siemens, NP              Passed - Patient is not pregnant      Passed - Last BP in normal range    BP Readings from Last 1 Encounters:  04/06/23 123/80           rosuvastatin  (CRESTOR ) 40 MG tablet 90 tablet 0    Sig: Take 1 tablet (40 mg total) by mouth daily.     Cardiovascular:  Antilipid - Statins 2 Failed - 10/17/2023  4:35 PM      Failed - Cr in normal range and within 360 days    Creatinine, Ser  Date Value Ref Range Status  09/28/2022 0.77 0.76 - 1.27 mg/dL Final         Failed - Lipid Panel in normal range within the last 12 months    Cholesterol, Total  Date Value Ref Range Status  04/06/2023 120 100 - 199 mg/dL Final   LDL Chol Calc (NIH)  Date Value Ref Range Status  04/06/2023 42 0 - 99 mg/dL Final   HDL  Date Value Ref Range Status  04/06/2023 32 (L) >39 mg/dL Final   Triglycerides  Date Value Ref Range Status  04/06/2023 300 (H) 0 - 149 mg/dL Final         Passed - Patient is not pregnant      Passed - Valid encounter within last 12 months    Recent Outpatient Visits  6 months ago Type 2 diabetes mellitus without complication, unspecified whether long term insulin  use (HCC)   [place] [facility] Marius Siemens, NP   9 months ago Type 2 diabetes mellitus without complication, unspecified whether long term insulin  use (HCC)   [place] [facility] Marius Siemens, NP   1 year ago Type 2 diabetes mellitus without complication, unspecified whether long term insulin  use (HCC)   [place] [facility] Marius Siemens, NP   1 year ago Type 2 diabetes mellitus without complication, unspecified whether long term insulin  use (HCC)   [place] [facility] Marius Siemens, NP   1 year ago Type 2 diabetes mellitus without complication, unspecified whether long term insulin  use (HCC)   [place] [facility] Marius Siemens, NP              Signed Prescriptions Disp Refills   pantoprazole  (PROTONIX ) 40 MG tablet 90 tablet 0    Sig: Take 1 tablet (40 mg total) by mouth daily.     Gastroenterology:  Proton Pump Inhibitors Passed - 10/17/2023  4:35 PM      Passed - Valid encounter within last 12 months    Recent Outpatient Visits           6 months ago Type 2 diabetes mellitus without complication, unspecified whether long term insulin  use (HCC)   [place] [facility] Marius Siemens, NP   9 months ago Type 2 diabetes mellitus without complication, unspecified whether long term insulin  use (HCC)   [place] [facility] Marius Siemens, NP   1 year ago Type 2 diabetes mellitus without complication, unspecified whether long term insulin  use (HCC)   [place] [facility] Marius Siemens, NP   1 year ago Type 2 diabetes mellitus without complication, unspecified whether long term insulin  use (HCC)   [place] [facility] Marius Siemens, NP   1 year ago Type 2 diabetes mellitus without complication, unspecified whether long term insulin  use (HCC)    [facility] Marius Siemens, NP

## 2023-10-26 ENCOUNTER — Other Ambulatory Visit: Payer: Self-pay

## 2023-11-22 ENCOUNTER — Other Ambulatory Visit: Payer: Self-pay

## 2024-02-03 ENCOUNTER — Other Ambulatory Visit (INDEPENDENT_AMBULATORY_CARE_PROVIDER_SITE_OTHER): Payer: Self-pay | Admitting: Primary Care

## 2024-02-03 ENCOUNTER — Other Ambulatory Visit: Payer: Self-pay

## 2024-02-03 DIAGNOSIS — I10 Essential (primary) hypertension: Secondary | ICD-10-CM

## 2024-02-03 DIAGNOSIS — Z760 Encounter for issue of repeat prescription: Secondary | ICD-10-CM

## 2024-02-03 DIAGNOSIS — E119 Type 2 diabetes mellitus without complications: Secondary | ICD-10-CM

## 2024-02-09 ENCOUNTER — Other Ambulatory Visit: Payer: Self-pay

## 2024-02-23 ENCOUNTER — Other Ambulatory Visit: Payer: Self-pay

## 2024-02-23 ENCOUNTER — Ambulatory Visit (INDEPENDENT_AMBULATORY_CARE_PROVIDER_SITE_OTHER): Admitting: Primary Care

## 2024-02-23 ENCOUNTER — Other Ambulatory Visit (INDEPENDENT_AMBULATORY_CARE_PROVIDER_SITE_OTHER): Payer: Self-pay | Admitting: Primary Care

## 2024-02-23 ENCOUNTER — Encounter (INDEPENDENT_AMBULATORY_CARE_PROVIDER_SITE_OTHER): Payer: Self-pay | Admitting: Primary Care

## 2024-02-23 VITALS — BP 121/75 | HR 88 | Resp 16 | Ht 69.0 in | Wt 215.6 lb

## 2024-02-23 DIAGNOSIS — I10 Essential (primary) hypertension: Secondary | ICD-10-CM

## 2024-02-23 DIAGNOSIS — E119 Type 2 diabetes mellitus without complications: Secondary | ICD-10-CM

## 2024-02-23 DIAGNOSIS — E782 Mixed hyperlipidemia: Secondary | ICD-10-CM

## 2024-02-23 DIAGNOSIS — Z7985 Long-term (current) use of injectable non-insulin antidiabetic drugs: Secondary | ICD-10-CM | POA: Diagnosis not present

## 2024-02-23 DIAGNOSIS — Z760 Encounter for issue of repeat prescription: Secondary | ICD-10-CM

## 2024-02-23 DIAGNOSIS — Z2821 Immunization not carried out because of patient refusal: Secondary | ICD-10-CM

## 2024-02-23 LAB — POCT GLYCOSYLATED HEMOGLOBIN (HGB A1C): HbA1c, POC (controlled diabetic range): 7.9 % — AB (ref 0.0–7.0)

## 2024-02-23 MED ORDER — TRULICITY 1.5 MG/0.5ML ~~LOC~~ SOAJ
1.5000 mg | SUBCUTANEOUS | 4 refills | Status: DC
Start: 2024-02-23 — End: 2024-03-30
  Filled 2024-02-23: qty 2, 28d supply, fill #0

## 2024-02-23 NOTE — Progress Notes (Signed)
 [facility]  Frederick Cervantes, is a 48 y.o. male  [id number]  [id number]  DOB - 07/21/1976  Chief Complaint  Patient presents with   Diabetes   Hyperlipidemia       Subjective:   Frederick Cervantes is a 48 y.o. male here today for a follow up visit. Patient has No headache, No chest pain, No abdominal pain - No Nausea, No new weakness tingling or numbness, No Cough - shortness of breath Diabetes  Hyperlipidemia    No problems updated.  Comprehensive ROS Pertinent positive and negative noted in HPI   No Known Allergies  Past Medical History:  Diagnosis Date   Diabetes mellitus without complication (HCC)    Hypertension    Ulcer, stomach peptic     Current Outpatient Medications on File Prior to Visit  Medication Sig Dispense Refill   aspirin EC 81 MG tablet Take 81 mg by mouth daily as needed (for hypertension). (Patient not taking: Reported on 09/10/2021)     Blood Glucose Monitoring Suppl (TRUE METRIX METER) w/Device KIT Use to check blood sugar once daily. (Patient not taking: Reported on 09/10/2021) 1 kit 0   Dulaglutide  (TRULICITY ) 1.5 MG/0.5ML SOAJ Inject 1.5 mg into the skin once a week. 2 mL 4   glucose blood (TRUE METRIX BLOOD GLUCOSE TEST) test strip Use to check blood sugar once daily. 100 each 2   ibuprofen  (ADVIL ) 800 MG tablet Take 1 tablet (800 mg total) by mouth every 8 (eight) hours as needed. 90 tablet 0   lisinopril  (ZESTRIL ) 10 MG tablet Take 1 tablet (10 mg total) by mouth daily. 90 tablet 1   metFORMIN  (GLUCOPHAGE -XR) 500 MG 24 hr tablet Take 2 tablets (1,000 mg total) by mouth daily with breakfast. 180 tablet 2   ondansetron  (ZOFRAN ) 4 MG tablet Take 1 tablet (4 mg total) by mouth every 6 (six) hours as needed for nausea or vomiting. 12 tablet 0   pantoprazole  (PROTONIX ) 40 MG tablet Take 1 tablet (40 mg total) by mouth daily. 90 tablet 0   rosuvastatin  (CRESTOR ) 40 MG tablet Take 1 tablet (40 mg total) by mouth daily. 90 tablet  0   tiZANidine  (ZANAFLEX ) 4 MG tablet Take 0.5-1 tablets (2-4 mg total) by mouth every 8 (eight) hours as needed for muscle spasms. 30 tablet 1   TRUEplus Lancets 28G MISC Use to check blood sugar once daily. 100 each 2   No current facility-administered medications on file prior to visit.   Health Maintenance  Topic Date Due   HPV Vaccine (1 - 3-dose SCDM series) Never done   Eye exam for diabetics  06/24/2021   COVID-19 Vaccine (1 - 2024-25 season) Never done   Colon Cancer Screening  Never done   Yearly kidney function blood test for diabetes  09/29/2023   Yearly kidney health urinalysis for diabetes  09/29/2023   Complete foot exam   09/29/2023   Pneumococcal Vaccination (1 of 2 - PCV) 02/22/2025*   Flu Shot  04/06/2024   Hemoglobin A1C  08/24/2024   DTaP/Tdap/Td vaccine (2 - Td or Tdap) 12/28/2032   Hepatitis C Screening  Completed   HIV Screening  Completed   Meningitis B Vaccine  Aged Out  *Topic was postponed. The date shown is not the original due date.    Objective:   Vitals:   02/23/24 1524  BP: 121/75  Pulse: 88  Resp: 16  SpO2: 98%  Weight: 215 lb 9.6 oz (97.8 kg)  Height: 5'  9 (1.753 m)   BP Readings from Last 3 Encounters:  02/23/24 121/75  04/06/23 123/80  02/24/23 (!) 142/78      Physical Exam Vitals reviewed.  Constitutional:      Appearance: He is obese.  HENT:     Head: Normocephalic.     Right Ear: Tympanic membrane and external ear normal.     Left Ear: Tympanic membrane and external ear normal.     Nose: Nose normal.   Eyes:     Extraocular Movements: Extraocular movements intact.     Pupils: Pupils are equal, round, and reactive to light.    Cardiovascular:     Rate and Rhythm: Normal rate and regular rhythm.  Pulmonary:     Effort: Pulmonary effort is normal.     Breath sounds: Normal breath sounds.  Abdominal:     General: Bowel sounds are normal. There is distension.     Palpations: Abdomen is soft.   Musculoskeletal:         General: Normal range of motion.     Cervical back: Normal range of motion and neck supple.   Skin:    General: Skin is warm and dry.   Neurological:     Mental Status: He is oriented to person, place, and time.   Psychiatric:        Mood and Affect: Mood normal.        Behavior: Behavior normal.        Thought Content: Thought content normal.        Judgment: Judgment normal.       Assessment & Plan  Frederick Cervantes was seen today for diabetes and hyperlipidemia.  Diagnoses and all orders for this visit:  Type 2 diabetes mellitus without complication, unspecified whether long term insulin  use (HCC) - educated on lifestyle modifications, including but not limited to diet choices and adding exercise to daily routine.   -     POCT glycosylated hemoglobin (Hb A1C) -     CMP14+EGFR -     Lipid panel -     Microalbumin / creatinine urine ratio -     Dulaglutide  (TRULICITY ) 1.5 MG/0.5ML SOAJ; Inject 1.5 mg into the skin once a week.  Essential hypertension Well controlled  on lisinopril  10mg  -     CBC with Differential/Platelet -     CMP14+EGFR  Mixed hyperlipidemia On Crestor   -     Lipid panel  Pneumococcal vaccination declined  Medication refill -     Dulaglutide  (TRULICITY ) 1.5 MG/0.5ML SOAJ; Inject 1.5 mg into the skin once a week.    Patient have been counseled extensively about nutrition and exercise. Other issues discussed during this visit include: low cholesterol diet, weight control and daily exercise, foot care, annual eye examinations at Ophthalmology, importance of adherence with medications and regular follow-up. We also discussed long term complications of uncontrolled diabetes and hypertension.   Return in about 3 months (around 05/25/2024).  The patient was given clear instructions to go to ER or return to medical center if symptoms don't improve, worsen or new problems develop. The patient verbalized understanding. The patient was told to call to get lab results  if they haven't heard anything in the next week.   This note has been created with Education officer, environmental. Any transcriptional errors are unintentional.   Marius Siemens, NP 02/23/2024, 3:53 PM

## 2024-02-24 ENCOUNTER — Other Ambulatory Visit: Payer: Self-pay

## 2024-02-24 ENCOUNTER — Telehealth (INDEPENDENT_AMBULATORY_CARE_PROVIDER_SITE_OTHER): Payer: Self-pay

## 2024-02-24 ENCOUNTER — Ambulatory Visit (INDEPENDENT_AMBULATORY_CARE_PROVIDER_SITE_OTHER): Payer: Self-pay | Admitting: Primary Care

## 2024-02-24 LAB — LIPID PANEL
Chol/HDL Ratio: 7.4 ratio — ABNORMAL HIGH (ref 0.0–5.0)
Cholesterol, Total: 236 mg/dL — ABNORMAL HIGH (ref 100–199)
HDL: 32 mg/dL — ABNORMAL LOW (ref >39)
LDL Chol Calc (NIH): 112 mg/dL — ABNORMAL HIGH (ref 0–99)
Triglycerides: 529 mg/dL — ABNORMAL HIGH (ref 0–149)
VLDL Cholesterol Cal: 92 mg/dL — ABNORMAL HIGH (ref 5–40)

## 2024-02-24 LAB — CMP14+EGFR
ALT: 18 IU/L (ref 0–44)
AST: 14 IU/L (ref 0–40)
Albumin: 4.2 g/dL (ref 4.1–5.1)
Alkaline Phosphatase: 81 IU/L (ref 44–121)
BUN/Creatinine Ratio: 32 — ABNORMAL HIGH (ref 9–20)
BUN: 17 mg/dL (ref 6–24)
Bilirubin Total: 0.2 mg/dL (ref 0.0–1.2)
CO2: 18 mmol/L — ABNORMAL LOW (ref 20–29)
Calcium: 9.7 mg/dL (ref 8.7–10.2)
Chloride: 99 mmol/L (ref 96–106)
Creatinine, Ser: 0.53 mg/dL — ABNORMAL LOW (ref 0.76–1.27)
Globulin, Total: 2.8 g/dL (ref 1.5–4.5)
Glucose: 331 mg/dL — ABNORMAL HIGH (ref 70–99)
Potassium: 4.6 mmol/L (ref 3.5–5.2)
Sodium: 136 mmol/L (ref 134–144)
Total Protein: 7 g/dL (ref 6.0–8.5)
eGFR: 126 mL/min/{1.73_m2} (ref >59)

## 2024-02-24 LAB — CBC WITH DIFFERENTIAL/PLATELET
Basophils Absolute: 0 10*3/uL (ref 0.0–0.2)
Basos: 0 %
EOS (ABSOLUTE): 0.1 10*3/uL (ref 0.0–0.4)
Eos: 1 %
Hematocrit: 47 % (ref 37.5–51.0)
Hemoglobin: 15.6 g/dL (ref 13.0–17.7)
Immature Grans (Abs): 0 10*3/uL (ref 0.0–0.1)
Immature Granulocytes: 0 %
Lymphocytes Absolute: 3 10*3/uL (ref 0.7–3.1)
Lymphs: 30 %
MCH: 30.8 pg (ref 26.6–33.0)
MCHC: 33.2 g/dL (ref 31.5–35.7)
MCV: 93 fL (ref 79–97)
Monocytes Absolute: 0.6 10*3/uL (ref 0.1–0.9)
Monocytes: 6 %
Neutrophils Absolute: 6.3 10*3/uL (ref 1.4–7.0)
Neutrophils: 63 %
Platelets: 301 10*3/uL (ref 150–450)
RBC: 5.06 x10E6/uL (ref 4.14–5.80)
RDW: 13 % (ref 11.6–15.4)
WBC: 10.1 10*3/uL (ref 3.4–10.8)

## 2024-02-24 LAB — MICROALBUMIN / CREATININE URINE RATIO
Creatinine, Urine: 44.9 mg/dL
Microalb/Creat Ratio: 145 mg/g{creat} — ABNORMAL HIGH (ref 0–29)
Microalbumin, Urine: 65.3 ug/mL

## 2024-02-24 MED ORDER — GEMFIBROZIL 600 MG PO TABS
600.0000 mg | ORAL_TABLET | Freq: Two times a day (BID) | ORAL | 1 refills | Status: DC
  Filled 2024-02-24: qty 180, 90d supply, fill #0

## 2024-02-24 NOTE — Telephone Encounter (Signed)
 Will forward to provider

## 2024-02-24 NOTE — Telephone Encounter (Signed)
 error

## 2024-03-01 ENCOUNTER — Other Ambulatory Visit: Payer: Self-pay

## 2024-03-01 ENCOUNTER — Telehealth (INDEPENDENT_AMBULATORY_CARE_PROVIDER_SITE_OTHER): Payer: Self-pay | Admitting: Pharmacist

## 2024-03-01 MED ORDER — ROSUVASTATIN CALCIUM 40 MG PO TABS
40.0000 mg | ORAL_TABLET | Freq: Every day | ORAL | 0 refills | Status: DC
  Filled 2024-03-01: qty 90, 90d supply, fill #0

## 2024-03-01 MED ORDER — METFORMIN HCL ER 500 MG PO TB24
1000.0000 mg | ORAL_TABLET | Freq: Every day | ORAL | 2 refills | Status: AC
  Filled 2024-03-01: qty 180, 90d supply, fill #0
  Filled 2024-05-31 – 2024-06-15 (×2): qty 180, 90d supply, fill #1
  Filled 2024-09-17: qty 180, 90d supply, fill #2

## 2024-03-01 MED ORDER — LISINOPRIL 10 MG PO TABS
10.0000 mg | ORAL_TABLET | Freq: Every day | ORAL | 1 refills | Status: DC
  Filled 2024-03-01: qty 90, 90d supply, fill #0
  Filled 2024-06-15: qty 90, 90d supply, fill #1

## 2024-03-01 NOTE — Telephone Encounter (Signed)
 Called to reschedule appt for a different time. Pt was able to do so at this time.

## 2024-03-02 ENCOUNTER — Other Ambulatory Visit: Payer: Self-pay

## 2024-03-15 ENCOUNTER — Encounter: Payer: Self-pay | Admitting: Primary Care

## 2024-03-22 ENCOUNTER — Ambulatory Visit: Admitting: Pharmacist

## 2024-03-29 ENCOUNTER — Telehealth: Payer: Self-pay | Admitting: Primary Care

## 2024-03-29 NOTE — Telephone Encounter (Signed)
 Called pt to confirm appt for 7/25 LVM

## 2024-03-30 ENCOUNTER — Encounter: Payer: Self-pay | Admitting: Pharmacist

## 2024-03-30 ENCOUNTER — Ambulatory Visit: Attending: Primary Care | Admitting: Pharmacist

## 2024-03-30 ENCOUNTER — Other Ambulatory Visit: Payer: Self-pay

## 2024-03-30 DIAGNOSIS — E119 Type 2 diabetes mellitus without complications: Secondary | ICD-10-CM | POA: Diagnosis not present

## 2024-03-30 DIAGNOSIS — Z7984 Long term (current) use of oral hypoglycemic drugs: Secondary | ICD-10-CM

## 2024-03-30 DIAGNOSIS — Z7985 Long-term (current) use of injectable non-insulin antidiabetic drugs: Secondary | ICD-10-CM

## 2024-03-30 MED ORDER — OZEMPIC (0.25 OR 0.5 MG/DOSE) 2 MG/3ML ~~LOC~~ SOPN
0.5000 mg | PEN_INJECTOR | SUBCUTANEOUS | 0 refills | Status: AC
  Filled 2024-03-30: qty 3, 28d supply, fill #0

## 2024-03-30 MED ORDER — SEMAGLUTIDE (1 MG/DOSE) 4 MG/3ML ~~LOC~~ SOPN
1.0000 mg | PEN_INJECTOR | SUBCUTANEOUS | 1 refills | Status: DC
  Filled 2024-03-30: qty 3, 28d supply, fill #0

## 2024-03-30 NOTE — Progress Notes (Signed)
    S:     No chief complaint on file.  48 y.o. male who presents for diabetes evaluation, education, and management. Patient arrives in good spirits and presents without any assistance.  Patient was referred and last seen by Primary Care Provider, Rosaline Bohr, on 02/23/2024. At that visit, A1c was 7.9 (up from 7.6% ~3 months prior).   PMH is significant for T2DM. Patient reports Diabetes is longstanding. In our EMR, he does not have any hospital admissions for DKA. No pancreatitis hx. He has no known hx of clinical ASCVD, CHF, or CKD. No thyroid cancer hx.   Family/Social History:  -Fhx: no pertinent positives  -Tobacco: current 1 PPD smoker -Alcohol:   Current diabetes medications include: metformin  500 mg (2 tablets in the AM) Trulicity  1.5 mg weekly Patient reports adherence to taking all medications as prescribed.   Insurance coverage: [place] Medicaid  Patient denies hypoglycemic events.  Reported home fasting blood sugars: 140s-180s   Patient reports nocturia (nighttime urination).  Patient denies neuropathy (nerve pain). Patient reports visual changes. Patient reports self foot exams.   Patient reported dietary habits: Eats 1 meals/day -Breakfast: snack  -Lunch: skips -Dinner: bigger meal  Patient-reported exercise habits: none  O:  Lab Results  Component Value Date   HGBA1C 7.9 (A) 02/23/2024   There were no vitals filed for this visit.  Lipid Panel     Component Value Date/Time   CHOL 236 (H) 02/23/2024 1528   TRIG 529 (H) 02/23/2024 1528   HDL 32 (L) 02/23/2024 1528   CHOLHDL 7.4 (H) 02/23/2024 1528   LDLCALC 112 (H) 02/23/2024 1528    Clinical Atherosclerotic Cardiovascular Disease (ASCVD): No  The 10-year ASCVD risk score (Arnett DK, et al., 2019) is: 23.4%   Values used to calculate the score:     Age: 48 years     Clincally relevant sex: Male     Is Non-Hispanic African American: No     Diabetic: Yes     Tobacco smoker: Yes     Systolic  Blood Pressure: 121 mmHg     Is BP treated: Yes     HDL Cholesterol: 32 mg/dL     Total Cholesterol: 236 mg/dL   Patient is participating in a Managed Medicaid Plan: No   A/P: Diabetes longstanding currently uncontrolled. Patient is symptomatic from a hyperglycemia standpoint. He is not currently hypoglycemic but is able to verbalize appropriate hypoglycemia management plan. Medication adherence appears to be appropriate. Will change his Trulicity  to Ozempic for better control.  -Discontinued Trulicity .  -Start Ozempic 0.5mg  weekly x4 weeks then increase to Ozempic 1 mg weekly thereafter. -Patient educated on purpose, proper use, and potential adverse effects of Ozempic.  -Extensively discussed pathophysiology of diabetes, recommended lifestyle interventions, dietary effects on blood sugar control.  -Counseled on s/sx of and management of hypoglycemia.  -Next A1c anticipated 05/2024.   Written patient instructions provided. Patient verbalized understanding of treatment plan.  Total time in face to face counseling 30 minutes.    Follow-up:  Pharmacist in 1 month.  Herlene Fleeta Morris, PharmD, JAQUELINE, CPP Clinical Pharmacist [facility] [phone]

## 2024-03-31 ENCOUNTER — Other Ambulatory Visit: Payer: Self-pay

## 2024-04-02 ENCOUNTER — Other Ambulatory Visit: Payer: Self-pay

## 2024-04-13 ENCOUNTER — Other Ambulatory Visit: Payer: Self-pay

## 2024-05-01 ENCOUNTER — Telehealth: Payer: Self-pay | Admitting: Primary Care

## 2024-05-01 NOTE — Telephone Encounter (Signed)
 Called patient to confirm upcoming appointment 05/04/2024. Patient appointment has been successfully confirmed

## 2024-05-04 ENCOUNTER — Ambulatory Visit: Payer: Self-pay | Attending: Primary Care | Admitting: Pharmacist

## 2024-05-04 ENCOUNTER — Encounter: Payer: Self-pay | Admitting: Pharmacist

## 2024-05-04 ENCOUNTER — Other Ambulatory Visit: Payer: Self-pay

## 2024-05-04 DIAGNOSIS — Z7984 Long term (current) use of oral hypoglycemic drugs: Secondary | ICD-10-CM | POA: Diagnosis not present

## 2024-05-04 DIAGNOSIS — E119 Type 2 diabetes mellitus without complications: Secondary | ICD-10-CM | POA: Diagnosis not present

## 2024-05-04 DIAGNOSIS — Z7985 Long-term (current) use of injectable non-insulin antidiabetic drugs: Secondary | ICD-10-CM

## 2024-05-04 MED ORDER — OZEMPIC (0.25 OR 0.5 MG/DOSE) 2 MG/3ML ~~LOC~~ SOPN
0.5000 mg | PEN_INJECTOR | SUBCUTANEOUS | 2 refills | Status: AC
  Filled 2024-05-04: qty 3, 28d supply, fill #0
  Filled 2024-06-15: qty 3, 28d supply, fill #1
  Filled 2024-09-17: qty 3, 28d supply, fill #2

## 2024-05-04 NOTE — Progress Notes (Signed)
 S:     No chief complaint on file.  48 y.o. male who presents for diabetes evaluation, education, and management. Patient arrives in good spirits and presents without any assistance.  Patient was referred and last seen by Primary Care Provider, Rosaline Bohr, on 02/23/2024. At that visit, A1c was 7.9 (up from 7.6% ~3 months prior). I saw him on 03/30/24 and changed his Trulicity  to Ozempic .  PMH is significant for T2DM. Patient reports Diabetes is longstanding. In our EMR, he does not have any hospital admissions for DKA. No pancreatitis hx. He has no known hx of clinical ASCVD, CHF, or CKD. No thyroid cancer hx.  Today, pt reports doing well since last visit. I had instructed him to stop Trulicity  and start Ozempic  0.5 mg weekly when I saw him last month. Pt did stop Trulicity  and endorses adherence to the Ozempic  at once weekly dosing. However, he has been taking the 0.25 mg weekly dose. Denies any NV, abdominal pain. Denies any changes in vision. Has baseline blurry vision but does not see Dr. Octavia for several months. He is requesting a referral to an Optometrist for eye glasses today.    Family/Social History:  -Fhx: no pertinent positives  -Tobacco: current 1 PPD smoker -Alcohol:   Current diabetes medications include: metformin  500 mg (2 tablets in the AM) Ozempic  0.5 mg weekly (pt reports taking the 0.25 mg weekly dose) Patient reports adherence to taking all medications as prescribed.   Insurance coverage: [place] Medicaid  Patient denies hypoglycemic events.  Reported home fasting blood sugars: 110s - 120s.  Patient reports nocturia (nighttime urination).  Patient denies neuropathy (nerve pain). Patient reports visual changes. Patient reports self foot exams.   Patient reported dietary habits: Eats 1 meals/day -Breakfast: snack  -Lunch: skips -Dinner: bigger meal  Patient-reported exercise habits: none  O:  Lab Results  Component Value Date   HGBA1C 7.9 (A)  02/23/2024   There were no vitals filed for this visit.  Lipid Panel     Component Value Date/Time   CHOL 236 (H) 02/23/2024 1528   TRIG 529 (H) 02/23/2024 1528   HDL 32 (L) 02/23/2024 1528   CHOLHDL 7.4 (H) 02/23/2024 1528   LDLCALC 112 (H) 02/23/2024 1528    Clinical Atherosclerotic Cardiovascular Disease (ASCVD): No  The 10-year ASCVD risk score (Arnett DK, et al., 2019) is: 23.4%   Values used to calculate the score:     Age: 67 years     Clincally relevant sex: Male     Is Non-Hispanic African American: No     Diabetic: Yes     Tobacco smoker: Yes     Systolic Blood Pressure: 121 mmHg     Is BP treated: Yes     HDL Cholesterol: 32 mg/dL     Total Cholesterol: 236 mg/dL   Patient is participating in a Managed Medicaid Plan: No   A/P: Diabetes longstanding currently uncontrolled. Patient is symptomatic from a hyperglycemia standpoint. He is not currently hypoglycemic but is able to verbalize appropriate hypoglycemia management plan. Medication adherence appears to be appropriate. Will change his Trulicity  to Ozempic  for better control.  -Discontinued Trulicity .  -Increase Ozempic  to 0.5mg  weekly. -Continue metformin  at current dose.  -Patient educated on purpose, proper use, and potential adverse effects of Ozempic .  -Extensively discussed pathophysiology of diabetes, recommended lifestyle interventions, dietary effects on blood sugar control.  -Counseled on s/sx of and management of hypoglycemia.  -Next A1c anticipated 05/2024.   Written patient  instructions provided. Patient verbalized understanding of treatment plan.  Total time in face to face counseling 30 minutes.    Follow-up:  Pharmacist in October.  Herlene Fleeta Morris, PharmD, JAQUELINE, CPP Clinical Pharmacist [facility] [phone]

## 2024-05-05 LAB — LIPID PANEL
Chol/HDL Ratio: 3.4 ratio (ref 0.0–5.0)
Cholesterol, Total: 102 mg/dL (ref 100–199)
HDL: 30 mg/dL — ABNORMAL LOW (ref >39)
LDL Chol Calc (NIH): 46 mg/dL (ref 0–99)
Triglycerides: 153 mg/dL — ABNORMAL HIGH (ref 0–149)
VLDL Cholesterol Cal: 26 mg/dL (ref 5–40)

## 2024-05-08 ENCOUNTER — Other Ambulatory Visit (INDEPENDENT_AMBULATORY_CARE_PROVIDER_SITE_OTHER): Payer: Self-pay | Admitting: Primary Care

## 2024-05-08 ENCOUNTER — Ambulatory Visit (INDEPENDENT_AMBULATORY_CARE_PROVIDER_SITE_OTHER): Payer: Self-pay | Admitting: Primary Care

## 2024-05-08 DIAGNOSIS — E782 Mixed hyperlipidemia: Secondary | ICD-10-CM

## 2024-05-08 MED ORDER — ROSUVASTATIN CALCIUM 40 MG PO TABS
40.0000 mg | ORAL_TABLET | Freq: Every day | ORAL | 1 refills | Status: DC
  Filled 2024-05-08: qty 90, 90d supply, fill #0
  Filled 2024-09-17: qty 90, 90d supply, fill #1

## 2024-05-09 ENCOUNTER — Other Ambulatory Visit: Payer: Self-pay

## 2024-05-14 ENCOUNTER — Other Ambulatory Visit: Payer: Self-pay

## 2024-05-24 ENCOUNTER — Ambulatory Visit (INDEPENDENT_AMBULATORY_CARE_PROVIDER_SITE_OTHER): Payer: Self-pay | Admitting: Primary Care

## 2024-05-28 ENCOUNTER — Ambulatory Visit (INDEPENDENT_AMBULATORY_CARE_PROVIDER_SITE_OTHER): Admitting: Primary Care

## 2024-06-07 ENCOUNTER — Other Ambulatory Visit: Payer: Self-pay

## 2024-06-11 ENCOUNTER — Other Ambulatory Visit: Payer: Self-pay

## 2024-06-13 ENCOUNTER — Ambulatory Visit (INDEPENDENT_AMBULATORY_CARE_PROVIDER_SITE_OTHER): Admitting: Primary Care

## 2024-06-15 ENCOUNTER — Other Ambulatory Visit: Payer: Self-pay

## 2024-06-19 ENCOUNTER — Ambulatory Visit: Admitting: Pharmacist

## 2024-06-28 ENCOUNTER — Telehealth (INDEPENDENT_AMBULATORY_CARE_PROVIDER_SITE_OTHER): Payer: Self-pay

## 2024-06-28 NOTE — Telephone Encounter (Signed)
 Copied from CRM [id number]. Topic: Appointments - Appointment Scheduling >> Jun 28, 2024 12:25 PM Turkey B wrote: Patient called about 11/17 appt,, does he need to reschedule? Please cb

## 2024-07-09 ENCOUNTER — Ambulatory Visit (INDEPENDENT_AMBULATORY_CARE_PROVIDER_SITE_OTHER): Admitting: Primary Care

## 2024-07-23 ENCOUNTER — Ambulatory Visit (INDEPENDENT_AMBULATORY_CARE_PROVIDER_SITE_OTHER): Admitting: Primary Care

## 2024-09-14 ENCOUNTER — Telehealth (INDEPENDENT_AMBULATORY_CARE_PROVIDER_SITE_OTHER): Payer: Self-pay | Admitting: Primary Care

## 2024-09-14 NOTE — Telephone Encounter (Signed)
 Spoke to pt about upcoming appt.. Will be present

## 2024-09-17 ENCOUNTER — Other Ambulatory Visit (INDEPENDENT_AMBULATORY_CARE_PROVIDER_SITE_OTHER): Payer: Self-pay | Admitting: Primary Care

## 2024-09-17 ENCOUNTER — Other Ambulatory Visit: Payer: Self-pay

## 2024-09-17 ENCOUNTER — Ambulatory Visit (INDEPENDENT_AMBULATORY_CARE_PROVIDER_SITE_OTHER): Payer: Self-pay | Admitting: Primary Care

## 2024-09-17 ENCOUNTER — Encounter (INDEPENDENT_AMBULATORY_CARE_PROVIDER_SITE_OTHER): Payer: Self-pay | Admitting: Primary Care

## 2024-09-17 VITALS — BP 124/82 | HR 72 | Resp 16 | Ht 68.5 in | Wt 211.2 lb

## 2024-09-17 DIAGNOSIS — E782 Mixed hyperlipidemia: Secondary | ICD-10-CM

## 2024-09-17 DIAGNOSIS — E669 Obesity, unspecified: Secondary | ICD-10-CM | POA: Diagnosis not present

## 2024-09-17 DIAGNOSIS — I10 Essential (primary) hypertension: Secondary | ICD-10-CM

## 2024-09-17 DIAGNOSIS — E119 Type 2 diabetes mellitus without complications: Secondary | ICD-10-CM | POA: Diagnosis not present

## 2024-09-17 DIAGNOSIS — L602 Onychogryphosis: Secondary | ICD-10-CM | POA: Diagnosis not present

## 2024-09-17 DIAGNOSIS — M25551 Pain in right hip: Secondary | ICD-10-CM

## 2024-09-17 DIAGNOSIS — Z760 Encounter for issue of repeat prescription: Secondary | ICD-10-CM

## 2024-09-17 DIAGNOSIS — Z1211 Encounter for screening for malignant neoplasm of colon: Secondary | ICD-10-CM

## 2024-09-17 DIAGNOSIS — Z1159 Encounter for screening for other viral diseases: Secondary | ICD-10-CM

## 2024-09-17 DIAGNOSIS — Z794 Long term (current) use of insulin: Secondary | ICD-10-CM | POA: Diagnosis not present

## 2024-09-17 NOTE — Progress Notes (Unsigned)
 " [facility]  Frederick Cervantes, is a 49 y.o. male  [id number]  [id number]  DOB - March 27, 1976  Chief Complaint  Patient presents with   Diabetes    A1C-9.5   Hyperlipidemia       Subjective:   Frederick Cervantes is a 49 y.o. male here today for a follow up visit. Management of hypertension which is well-controlled. Patient has No headache, No chest pain, No abdominal pain - No Nausea, No new weakness tingling or numbness, No Cough - shortness of breath.  Type 2 diabetes A1c has gone up to 9.5 previously 7.9 will refer to [facility] clinical pharmacist. Patient complains today of right hip hurting when he is bending or trying to walk up incline or stairs.  External rotation causes increase in pain.  Pain he rates at 9/10 referred to orthopedics for evaluation and treatment  Problem  Type 2 Diabetes Mellitus Without Complication, Without Long-Term Current Use of Insulin  (Hcc)    Comprehensive ROS Pertinent positive and negative noted in HPI   Allergies[1]  Past Medical History:  Diagnosis Date   Diabetes mellitus without complication (HCC)    Hypertension    Ulcer, stomach peptic     Medications Ordered Prior to Encounter[2] Health Maintenance  Topic Date Due   Hepatitis B Vaccine (1 of 3 - 19+ 3-dose series) Never done   Eye exam for diabetics  06/24/2021   Colon Cancer Screening  Never done   COVID-19 Vaccine (1 - 2025-26 season) Never done   Hemoglobin A1C  08/24/2024   Flu Shot  12/04/2024*   Pneumococcal Vaccine (1 of 2 - PCV) 02/22/2025*   Yearly kidney function blood test for diabetes  02/22/2025   Yearly kidney health urinalysis for diabetes  02/22/2025   Complete foot exam   09/17/2025   DTaP/Tdap/Td vaccine (2 - Td or Tdap) 12/28/2032   Hepatitis C Screening  Completed   HIV Screening  Completed   HPV Vaccine  Aged Out   Meningitis B Vaccine  Aged Out  *Topic was postponed. The date shown is not the original due date.    Objective:    Vitals:   09/17/24 0903  BP: 124/82  Pulse: 72  Resp: 16  SpO2: 100%  Weight: 211 lb 3.2 oz (95.8 kg)  Height: 5' 8.5 (1.74 m)   BP Readings from Last 3 Encounters:  09/17/24 124/82  02/23/24 121/75  04/06/23 123/80      Physical Exam Vitals reviewed.  Constitutional:      Appearance: He is obese.  HENT:     Head: Normocephalic.     Right Ear: Tympanic membrane and external ear normal.     Left Ear: Tympanic membrane and external ear normal.     Nose: Nose normal.  Eyes:     Extraocular Movements: Extraocular movements intact.     Pupils: Pupils are equal, round, and reactive to light.  Cardiovascular:     Rate and Rhythm: Normal rate and regular rhythm.  Pulmonary:     Effort: Pulmonary effort is normal.     Breath sounds: Wheezing present.     Comments: RUL/LLL states from smoking - not ready to stop Abdominal:     General: Bowel sounds are normal. There is distension.     Palpations: Abdomen is soft.  Musculoskeletal:        General: Tenderness present. Normal range of motion.     Cervical back: Normal range of motion and neck supple.  Skin:  General: Skin is warm and dry.  Neurological:     Mental Status: He is oriented to person, place, and time.  Psychiatric:        Mood and Affect: Mood normal.        Behavior: Behavior normal.        Thought Content: Thought content normal.        Judgment: Judgment normal.       Assessment & Plan    Frederick Cervantes was seen today for diabetes and hyperlipidemia.  Diagnoses and all orders for this visit:  Essential hypertension DIET: Limit salt intake, read nutrition labels to check salt content, limit fried and high fatty foods  Avoid using multisymptom OTC cold preparations that generally contain sudafed which can rise BP. Consult with pharmacist on best cold relief products to use for persons with HTN EXERCISE Discussed incorporating exercise such as walking - 30 minutes most days of the week and can do in 10  minute intervals    -     CMP14+EGFR  Onychogryphosis -     Ambulatory referral to Podiatry  Mixed hyperlipidemia -     Lipid panel  Comprehensive diabetic foot examination, type 2 DM, encounter for [facility]) Completed   Mildly obese Obesity is 30-39 indicating an excess in caloric intake or underlining conditions. This may lead to other co-morbidities. Educated on lifestyle modifications of diet and exercise which may reduce obesity.    Colon cancer screening -     Ambulatory referral to Gastroenterology  Right hip pain -     Ambulatory referral to Orthopedic Surgery  Type 2 diabetes mellitus without complication, without long-term current use of insulin  (HCC)  Complications from uncontrolled diabetes -diabetic retinopathy leading to blindness, diabetic nephropathy leading to dialysis, decrease in circulation decrease in sores or wound healing which may lead to amputations and increase of heart attack and stroke . Effects on the liver- non-alcoholic fatty liver disease (NAFLD), which can progress to non-alcoholic steatohepatitis (NASH), cirrhosis, liver failure, and even liver cancer , lead to fat accumulation in the liver, causing NAFLD, and if left untreated, can lead to more severe liver damage.  -     CBC with Differential/Platelet -     POCT glycosylated hemoglobin (Hb A1C)  Need for hepatitis B screening test -     Hepatitis B surface antibody,qualitative  Patient have been counseled extensively about nutrition and exercise. Other issues discussed during this visit include: low cholesterol diet, weight control and daily exercise, foot care, annual eye examinations at Ophthalmology, importance of adherence with medications and regular follow-up. We also discussed long term complications of uncontrolled diabetes and hypertension.   Return in about 3 months (around 12/16/2024) for fasting labs/ DM/HTN/Lipids.  The patient was given clear instructions to go to ER or return to medical  center if symptoms don't improve, worsen or new problems develop. The patient verbalized understanding. The patient was told to call to get lab results if they haven't heard anything in the next week.   This note has been created with Education officer, environmental. Any transcriptional errors are unintentional.   Frederick SHAUNNA Bohr, NP 09/17/2024, 10:08 AM     [1] No Known Allergies [2]  Current Outpatient Medications on File Prior to Visit  Medication Sig Dispense Refill   aspirin EC 81 MG tablet Take 81 mg by mouth daily as needed (for hypertension).     Blood Glucose Monitoring Suppl (TRUE METRIX METER) w/Device KIT Use  to check blood sugar once daily. 1 kit 0   glucose blood (TRUE METRIX BLOOD GLUCOSE TEST) test strip Use to check blood sugar once daily. 100 each 2   ibuprofen  (ADVIL ) 800 MG tablet Take 1 tablet (800 mg total) by mouth every 8 (eight) hours as needed. 90 tablet 0   lisinopril  (ZESTRIL ) 10 MG tablet Take 1 tablet (10 mg total) by mouth daily. 90 tablet 1   metFORMIN  (GLUCOPHAGE -XR) 500 MG 24 hr tablet Take 2 tablets (1,000 mg total) by mouth daily with breakfast. 180 tablet 2   ondansetron  (ZOFRAN ) 4 MG tablet Take 1 tablet (4 mg total) by mouth every 6 (six) hours as needed for nausea or vomiting. 12 tablet 0   pantoprazole  (PROTONIX ) 40 MG tablet Take 1 tablet (40 mg total) by mouth daily. 90 tablet 0   rosuvastatin  (CRESTOR ) 40 MG tablet Take 1 tablet (40 mg total) by mouth daily. 90 tablet 1   Semaglutide ,0.25 or 0.5MG /DOS, (OZEMPIC , 0.25 OR 0.5 MG/DOSE,) 2 MG/3ML SOPN Inject 0.5 mg into the skin once a week. 3 mL 2   tiZANidine  (ZANAFLEX ) 4 MG tablet Take 0.5-1 tablets (2-4 mg total) by mouth every 8 (eight) hours as needed for muscle spasms. 30 tablet 1   TRUEplus Lancets 28G MISC Use to check blood sugar once daily. 100 each 2   No current facility-administered medications on file prior to visit.   "

## 2024-09-18 ENCOUNTER — Other Ambulatory Visit: Payer: Self-pay | Admitting: Family Medicine

## 2024-09-18 ENCOUNTER — Other Ambulatory Visit (INDEPENDENT_AMBULATORY_CARE_PROVIDER_SITE_OTHER): Payer: Self-pay | Admitting: Primary Care

## 2024-09-18 DIAGNOSIS — E119 Type 2 diabetes mellitus without complications: Secondary | ICD-10-CM

## 2024-09-18 LAB — CBC WITH DIFFERENTIAL/PLATELET
Basophils Absolute: 0 x10E3/uL (ref 0.0–0.2)
Basos: 1 %
EOS (ABSOLUTE): 0.2 x10E3/uL (ref 0.0–0.4)
Eos: 2 %
Hematocrit: 49.4 % (ref 37.5–51.0)
Hemoglobin: 16.5 g/dL (ref 13.0–17.7)
Immature Grans (Abs): 0 x10E3/uL (ref 0.0–0.1)
Immature Granulocytes: 0 %
Lymphocytes Absolute: 2.8 x10E3/uL (ref 0.7–3.1)
Lymphs: 34 %
MCH: 30.4 pg (ref 26.6–33.0)
MCHC: 33.4 g/dL (ref 31.5–35.7)
MCV: 91 fL (ref 79–97)
Monocytes Absolute: 0.6 x10E3/uL (ref 0.1–0.9)
Monocytes: 7 %
Neutrophils Absolute: 4.7 x10E3/uL (ref 1.4–7.0)
Neutrophils: 56 %
Platelets: 300 x10E3/uL (ref 150–450)
RBC: 5.42 x10E6/uL (ref 4.14–5.80)
RDW: 12.5 % (ref 11.6–15.4)
WBC: 8.4 x10E3/uL (ref 3.4–10.8)

## 2024-09-18 LAB — LIPID PANEL
Chol/HDL Ratio: 4.5 ratio (ref 0.0–5.0)
Cholesterol, Total: 150 mg/dL (ref 100–199)
HDL: 33 mg/dL — ABNORMAL LOW
LDL Chol Calc (NIH): 68 mg/dL (ref 0–99)
Triglycerides: 306 mg/dL — ABNORMAL HIGH (ref 0–149)
VLDL Cholesterol Cal: 49 mg/dL — ABNORMAL HIGH (ref 5–40)

## 2024-09-18 LAB — CMP14+EGFR
ALT: 30 IU/L (ref 0–44)
AST: 15 IU/L (ref 0–40)
Albumin: 4.8 g/dL (ref 4.1–5.1)
Alkaline Phosphatase: 79 IU/L (ref 47–123)
BUN/Creatinine Ratio: 19 (ref 9–20)
BUN: 14 mg/dL (ref 6–24)
Bilirubin Total: 0.3 mg/dL (ref 0.0–1.2)
CO2: 21 mmol/L (ref 20–29)
Calcium: 9.6 mg/dL (ref 8.7–10.2)
Chloride: 99 mmol/L (ref 96–106)
Creatinine, Ser: 0.72 mg/dL — ABNORMAL LOW (ref 0.76–1.27)
Globulin, Total: 2.3 g/dL (ref 1.5–4.5)
Glucose: 236 mg/dL — ABNORMAL HIGH (ref 70–99)
Potassium: 4.2 mmol/L (ref 3.5–5.2)
Sodium: 135 mmol/L (ref 134–144)
Total Protein: 7.1 g/dL (ref 6.0–8.5)
eGFR: 114 mL/min/1.73

## 2024-09-18 LAB — HEPATITIS B SURFACE ANTIBODY,QUALITATIVE: Hep B Surface Ab, Qual: NONREACTIVE

## 2024-09-18 NOTE — Telephone Encounter (Signed)
 Requested medication (s) are due for refill today: yes  Requested medication (s) are on the active medication list: yes  Last refill:  03/01/24  Future visit scheduled: yes  Notes to clinic:  unable to refill,  The following diagnosis record has been deleted:   Type 2 diabetes mellitus without complication, unspecified whether long term insulin  use [E11.9]       Requested Prescriptions  Pending Prescriptions Disp Refills   lisinopril  (ZESTRIL ) 10 MG tablet 90 tablet 1    Sig: Take 1 tablet (10 mg total) by mouth daily.     Cardiovascular:  ACE Inhibitors Failed - 09/18/2024 12:27 PM      Failed - Cr in normal range and within 180 days    Creatinine, Ser  Date Value Ref Range Status  09/17/2024 0.72 (L) 0.76 - 1.27 mg/dL Final         Passed - K in normal range and within 180 days    Potassium  Date Value Ref Range Status  09/17/2024 4.2 3.5 - 5.2 mmol/L Final         Passed - Patient is not pregnant      Passed - Last BP in normal range    BP Readings from Last 1 Encounters:  09/17/24 124/82         Passed - Valid encounter within last 6 months    Recent Outpatient Visits           Yesterday Essential hypertension   [place] [facility] Celestia Rosaline SQUIBB, NP   4 months ago Type 2 diabetes mellitus without complication, unspecified whether long term insulin  use   [facility] Fleeta Morris, Herrings L, RPH-CPP   5 months ago Type 2 diabetes mellitus without complication, unspecified whether long term insulin  use   [facility] Fleeta Morris, Rumson L, RPH-CPP   6 months ago Type 2 diabetes mellitus without complication, unspecified whether long term insulin  use   [place] [facility] Celestia Rosaline SQUIBB, NP   1 year ago Type 2 diabetes mellitus without complication, unspecified whether long term insulin  use     [facility] Celestia Rosaline SQUIBB, NP

## 2024-09-19 NOTE — Telephone Encounter (Signed)
 Requested medication (s) are due for refill today: yes  Requested medication (s) are on the active medication list: yes  Last refill:    Future visit scheduled: no  Notes to clinic:  Unable to refill, dx code needed. Routing for review     Requested Prescriptions  Pending Prescriptions Disp Refills   TRUEplus Lancets 28G MISC 100 each 2    Sig: Use to check blood sugar once daily.     Endocrinology: Diabetes - Testing Supplies Passed - 09/19/2024  1:44 PM      Passed - Valid encounter within last 12 months    Recent Outpatient Visits           2 days ago Essential hypertension   [place] [facility] Celestia Rosaline SQUIBB, NP   4 months ago Type 2 diabetes mellitus without complication, unspecified whether long term insulin  use   [facility] [place] Fleeta Morris, [place] L, RPH-CPP   5 months ago Type 2 diabetes mellitus without complication, unspecified whether long term insulin  use   [facility] [place] Fleeta Morris, [place] L, RPH-CPP   6 months ago Type 2 diabetes mellitus without complication, unspecified whether long term insulin  use   [place] [facility] Celestia Rosaline SQUIBB, NP   1 year ago Type 2 diabetes mellitus without complication, unspecified whether long term insulin  use   [place] [facility] Celestia Rosaline P, NP               glucose blood (TRUE METRIX BLOOD GLUCOSE TEST) test strip 100 each 2    Sig: Use to check blood sugar once daily.     Endocrinology: Diabetes - Testing Supplies Passed - 09/19/2024  1:44 PM      Passed - Valid encounter within last 12 months    Recent Outpatient Visits           2 days ago Essential hypertension   [place] [facility] Celestia Rosaline SQUIBB, NP   4 months ago Type 2 diabetes mellitus without complication, unspecified whether long  term insulin  use   [facility] [place] Fleeta Morris, [place] L, RPH-CPP   5 months ago Type 2 diabetes mellitus without complication, unspecified whether long term insulin  use   [facility] [facility] [place] Fleeta Morris, [place] L, RPH-CPP   6 months ago Type 2 diabetes mellitus without complication, unspecified whether long term insulin  use   [place] [facility] Celestia Rosaline SQUIBB, NP   1 year ago Type 2 diabetes mellitus without complication, unspecified whether long term insulin  use   [place] [facility] Celestia Rosaline SQUIBB, NP

## 2024-09-20 NOTE — Telephone Encounter (Signed)
 Patient needs new diabetes add per Herlene Type 2 diabetes mellitus without complication, without long-term current use of insulin  (HCC)  can no longer be used as DX. Thank You !!

## 2024-09-21 NOTE — Telephone Encounter (Signed)
 Will forward to provider

## 2024-09-25 ENCOUNTER — Ambulatory Visit: Admitting: Surgical

## 2024-09-25 LAB — POCT GLYCOSYLATED HEMOGLOBIN (HGB A1C): HbA1c, POC (controlled diabetic range): 9.5 % — AB (ref 0.0–7.0)

## 2024-09-26 ENCOUNTER — Other Ambulatory Visit: Payer: Self-pay

## 2024-09-26 ENCOUNTER — Ambulatory Visit: Payer: Self-pay | Admitting: Primary Care

## 2024-09-26 DIAGNOSIS — E782 Mixed hyperlipidemia: Secondary | ICD-10-CM

## 2024-09-26 MED ORDER — ROSUVASTATIN CALCIUM 40 MG PO TABS
40.0000 mg | ORAL_TABLET | Freq: Every day | ORAL | 1 refills | Status: AC
  Filled 2024-09-26: qty 90, 90d supply, fill #0

## 2024-09-28 ENCOUNTER — Ambulatory Visit: Admitting: Surgical

## 2024-09-28 ENCOUNTER — Ambulatory Visit: Admitting: Podiatry

## 2024-09-28 ENCOUNTER — Ambulatory Visit: Payer: Self-pay

## 2024-09-28 DIAGNOSIS — Z0189 Encounter for other specified special examinations: Secondary | ICD-10-CM | POA: Diagnosis not present

## 2024-09-28 DIAGNOSIS — M25551 Pain in right hip: Secondary | ICD-10-CM

## 2024-09-28 DIAGNOSIS — M216X1 Other acquired deformities of right foot: Secondary | ICD-10-CM

## 2024-09-28 DIAGNOSIS — E119 Type 2 diabetes mellitus without complications: Secondary | ICD-10-CM

## 2024-09-28 DIAGNOSIS — M216X2 Other acquired deformities of left foot: Secondary | ICD-10-CM

## 2024-09-28 NOTE — Progress Notes (Unsigned)
 Subjective: Frederick Cervantes presents today referred by Celestia Rosaline SQUIBB, NP for diabetic foot evaluation.  Patient relates many year history of diabetes.  Patient denies any history of foot wounds.  Patient denies any history of numbness, tingling, burning, pins/needles sensations.  Past Medical History:  Diagnosis Date   Diabetes mellitus without complication (HCC)    Hypertension    Ulcer, stomach peptic     Patient Active Problem List   Diagnosis Date Noted   Type 2 diabetes mellitus without complication, without long-term current use of insulin  (HCC) 05/28/2020    Past Surgical History:  Procedure Laterality Date   NO PAST SURGERIES      Medications Ordered Prior to Encounter[1]   Allergies[2]  Social History   Occupational History   Not on file  Tobacco Use   Smoking status: Every Day    Current packs/day: 1.00    Types: Cigarettes   Smokeless tobacco: Never  Substance and Sexual Activity   Alcohol use: Never   Drug use: Never   Sexual activity: Not on file    Family History  Problem Relation Age of Onset   Diabetes Neg Hx     Immunization History  Administered Date(s) Administered   Tdap 12/29/2022    Review of systems: Positive Findings in bold print.  Constitutional:  chills, fatigue, fever, sweats, weight change Communication: nurse, learning disability, sign presenter, broadcasting, hand writing, iPad/Android device Head: headaches, head injury Eyes: changes in vision, eye pain, glaucoma, cataracts, macular degeneration, diplopia, glare,  light sensitivity, eyeglasses or contacts, blindness Ears nose mouth throat: hearing impaired, hearing aids,  ringing in ears, deaf, sign language,  vertigo, nosebleeds,  rhinitis,  cold sores, snoring, swollen glands Cardiovascular: HTN, edema, arrhythmia, pacemaker in place, defibrillator in place, chest pain/tightness, chronic anticoagulation, blood clot, heart failure, MI Peripheral Vascular: leg cramps, varicose veins,  blood clots, lymphedema, varicosities Respiratory:  asthma, difficulty breathing, denies congestion, SOB, wheezing, cough, emphysema Gastrointestinal: change in appetite or weight, abdominal pain, constipation, diarrhea, nausea, vomiting, vomiting blood, change in bowel habits, abdominal pain, jaundice, rectal bleeding, hemorrhoids, GERD Genitourinary:  nocturia,  pain on urination, polyuria,  blood in urine, Foley catheter, urinary urgency, ESRD on hemodialysis Musculoskeletal: amputation, cramping, stiff joints, painful joints, decreased joint motion, fractures, OA, gout, hemiplegia, paraplegia, uses cane, wheelchair bound, uses walker, uses rollator Skin: +changes in toenails, color change, dryness, itching, mole changes,  rash, wound(s) Neurological: headaches, numbness in feet, paresthesias in feet, burning in feet, fainting,  seizures, change in speech, migraines, memory problems/poor historian, cerebral palsy, weakness, paralysis, CVA, TIA Endocrine: diabetes, hypothyroidism, hyperthyroidism,  goiter, dry mouth, flushing, heat intolerance, cold intolerance,  excessive thirst, denies polyuria,  nocturia Hematological:  easy bleeding, excessive bleeding, easy bruising, enlarged lymph nodes, on long term blood thinner, history of past transusions Allergy/immunological:  hives, eczema, frequent infections, multiple drug allergies, seasonal allergies, transplant recipient, multiple food allergies Psychiatric:  anxiety, depression, mood disorder, suicidal ideations, hallucinations, insomnia  Objective: There were no vitals filed for this visit. Vascular Examination: Capillary refill time less than 3 seconds x 10 digits.  Dorsalis pedis pulses palpable 2 out of 4.  Posterior tibial pulses palpable 2 out of 4.  Digital hair not present x 10 digits.  Skin temperature gradient WNL b/l.  Dermatological Examination: Skin with normal turgor, texture and tone b/l  Toenails 1-5 b/l discolored,  thick, dystrophic with subungual debris and pain with palpation to nailbeds due to thickness of nails.  Musculoskeletal: Muscle strength 5/5 to all  LE muscle groups.  Neurological: Sensation intact with 10 gram monofilament.  Vibratory sensation intact.  Assessment: NIDDM Encounter for diabetic foot examination Pes planovalgus with limb length discrepancy 1 inch shorter on the right side  Plan: Discussed diabetic foot care principles. Literature dispensed on today. Patient to continue soft, supportive shoe gear daily. Patient to report any pedal injuries to medical professional immediately. Follow up one year. Patient/POA to call should there be a concern in the interim.  Pes planovalgus/foot deformity -I explained to patient the etiology of pes planovalgus and relationship with heel pain/arch pain and various treatment options were discussed.  Given patient foot structure in the setting of heel pain/arch pain I believe patient will benefit from custom-made orthotics to help control the hindfoot motion support the arch of the foot and take the stress away from arches.  Patient agrees with the plan like to proceed with orthotics -Patient was casted for orthotics with 1 inch heel lift on the right side       [1]  Current Outpatient Medications on File Prior to Visit  Medication Sig Dispense Refill   aspirin EC 81 MG tablet Take 81 mg by mouth daily as needed (for hypertension).     Blood Glucose Monitoring Suppl (TRUE METRIX METER) w/Device KIT Use to check blood sugar once daily. 1 kit 0   ibuprofen  (ADVIL ) 800 MG tablet Take 1 tablet (800 mg total) by mouth every 8 (eight) hours as needed. 90 tablet 0   metFORMIN  (GLUCOPHAGE -XR) 500 MG 24 hr tablet Take 2 tablets (1,000 mg total) by mouth daily with breakfast. 180 tablet 2   ondansetron  (ZOFRAN ) 4 MG tablet Take 1 tablet (4 mg total) by mouth every 6 (six) hours as needed for nausea or vomiting. 12 tablet 0   pantoprazole   (PROTONIX ) 40 MG tablet Take 1 tablet (40 mg total) by mouth daily. 90 tablet 0   rosuvastatin  (CRESTOR ) 40 MG tablet Take 1 tablet (40 mg total) by mouth daily. 90 tablet 1   Semaglutide ,0.25 or 0.5MG /DOS, (OZEMPIC , 0.25 OR 0.5 MG/DOSE,) 2 MG/3ML SOPN Inject 0.5 mg into the skin once a week. 3 mL 2   tiZANidine  (ZANAFLEX ) 4 MG tablet Take 0.5-1 tablets (2-4 mg total) by mouth every 8 (eight) hours as needed for muscle spasms. 30 tablet 1   TRUEplus Lancets 28G MISC Use to check blood sugar once daily. 100 each 2   No current facility-administered medications on file prior to visit.  [2] No Known Allergies

## 2024-09-30 ENCOUNTER — Encounter: Payer: Self-pay | Admitting: Surgical

## 2024-09-30 NOTE — Progress Notes (Signed)
 "  Office Visit Note   Patient: Frederick Cervantes           Date of Birth: November 15, 1975           MRN: [id number] Visit Date: 09/28/2024 Requested by: Celestia Rosaline SQUIBB, NP [address] [place],  [place] [address] PCP: Celestia Rosaline SQUIBB, NP  Subjective: Chief Complaint  Patient presents with   Right Hip - Pain    HPI: Frederick Cervantes is a 49 y.o. male who presents to the office reporting right hip pain.  Patient describes hip pain has developed over the last 6 weeks without history of injury.  He states that he has pain primarily in the anterolateral aspect of the hip without low back pain or buttock pain.  Does have occasional groin pain.  Pain is worse with walking or stairs.  Has never had this issue before in the past.  No history of hip or back surgery.  No left-sided symptoms.  No mechanical symptoms such as clicking or popping.  He is able to lay on his right hip at night for about 5 to 10 minutes before having to turn over.  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                ROS: All systems reviewed are negative as they relate to the chief complaint within the history of present illness.  Patient denies fevers or chills.  Assessment & Plan: Visit Diagnoses:  1. Pain in right hip     Plan: Impression is 49 year old male with right hip pain that has come on without injury.  Most of his symptoms seem to point toward iliotibial band syndrome given the pain that refers down the IT band to the knee as well as positive Ober sign and pain that refers down the leg with palpation over ASIS.  We discussed options available to patient.  Would recommend starting with physical therapy and we can reevaluate in 4 to 6 weeks.  If no improvement, consider MRI scan at that time.  Radiographs taken today demonstrate no significant arthritis or abnormality of  the right hip but it does demonstrate leg length discrepancy with right leg shorter relative to the left leg.  Plan to follow-up in 4 to 6 weeks.  Follow-Up Instructions: No follow-ups on file.   Orders:  Orders Placed This Encounter  Procedures   XR HIP UNILAT W OR W/O PELVIS 2-3 VIEWS RIGHT   Ambulatory referral to Physical Therapy   No orders of the defined types were placed in this encounter.     Procedures: No procedures performed   Clinical Data: No additional findings.  Objective: Vital Signs: There were no vitals taken for this visit.  Physical Exam:  Constitutional: Patient appears well-developed HEENT:  Head: Normocephalic Eyes:EOM are normal Neck: Normal range of motion Cardiovascular: Normal rate Pulmonary/chest: Effort normal Neurologic: Patient is alert Skin: Skin is warm Psychiatric: Patient has normal mood and affect  Ortho Exam: Ortho exam demonstrates right hip with tenderness over ASIS moderately but not AIIS.  Tenderness over ASIS does reproduce pain down the anterolateral thigh.  He has positive Ober sign in the lateral position reproducing his hip pain.  Negative Stinchfield sign.  He has no pain with passive hip flexion or rotation.  No tenderness  over SI joint.  He has mild to moderate tenderness over the greater trochanter primarily around the anterior and lateral facet of the trochanter but not the posterior.  Able to perform straight leg raise.  Intact hip flexion, quadricep, hamstring, dorsiflexion, plantarflexion strength rated 5/5 bilaterally.  2+ patellar tendon reflexes bilaterally.  No clonus noted bilaterally.  Specialty Comments:  No specialty comments available.  Imaging: No results found.   PMFS History: Patient Active Problem List   Diagnosis Date Noted   Type 2 diabetes mellitus without complication, without long-term current use of insulin  (HCC) 05/28/2020   Past Medical History:  Diagnosis Date   Diabetes mellitus without  complication (HCC)    Hypertension    Ulcer, stomach peptic     Family History  Problem Relation Age of Onset   Diabetes Neg Hx     Past Surgical History:  Procedure Laterality Date   NO PAST SURGERIES     Social History   Occupational History   Not on file  Tobacco Use   Smoking status: Every Day    Current packs/day: 1.00    Types: Cigarettes   Smokeless tobacco: Never  Substance and Sexual Activity   Alcohol use: Never   Drug use: Never   Sexual activity: Not on file        "

## 2024-10-01 ENCOUNTER — Other Ambulatory Visit (INDEPENDENT_AMBULATORY_CARE_PROVIDER_SITE_OTHER): Payer: Self-pay | Admitting: Primary Care

## 2024-10-01 ENCOUNTER — Other Ambulatory Visit: Payer: Self-pay | Admitting: Family Medicine

## 2024-10-01 ENCOUNTER — Other Ambulatory Visit (HOSPITAL_COMMUNITY): Payer: Self-pay

## 2024-10-01 ENCOUNTER — Other Ambulatory Visit: Payer: Self-pay

## 2024-10-01 DIAGNOSIS — E119 Type 2 diabetes mellitus without complications: Secondary | ICD-10-CM

## 2024-10-01 MED ORDER — ACCU-CHEK GUIDE W/DEVICE KIT
PACK | 0 refills | Status: AC
  Filled 2024-10-01 (×2): qty 1, 30d supply, fill #0

## 2024-10-01 MED ORDER — ACCU-CHEK GUIDE TEST VI STRP
ORAL_STRIP | 6 refills | Status: AC
  Filled 2024-10-01: qty 100, 30d supply, fill #0
  Filled 2024-10-01: qty 100, 33d supply, fill #0

## 2024-10-01 MED ORDER — ACCU-CHEK SOFTCLIX LANCETS MISC
6 refills | Status: AC
  Filled 2024-10-01: qty 100, 30d supply, fill #0
  Filled 2024-10-01: qty 100, 33d supply, fill #0

## 2024-10-02 ENCOUNTER — Other Ambulatory Visit: Payer: Self-pay

## 2024-10-02 ENCOUNTER — Telehealth: Payer: Self-pay | Admitting: Surgical

## 2024-10-02 ENCOUNTER — Other Ambulatory Visit: Payer: Self-pay | Admitting: Primary Care

## 2024-10-02 DIAGNOSIS — I10 Essential (primary) hypertension: Secondary | ICD-10-CM

## 2024-10-02 DIAGNOSIS — Z760 Encounter for issue of repeat prescription: Secondary | ICD-10-CM

## 2024-10-02 MED ORDER — LISINOPRIL 10 MG PO TABS
10.0000 mg | ORAL_TABLET | Freq: Every day | ORAL | 1 refills | Status: AC
  Filled 2024-10-02: qty 90, 90d supply, fill #0

## 2024-10-02 NOTE — Telephone Encounter (Signed)
 Called pt per Rosina B for pt to follow up with [facility] 4 to 6 wks. Left vm for pt to call and schedule

## 2024-10-05 ENCOUNTER — Ambulatory Visit: Attending: Surgical

## 2024-10-05 ENCOUNTER — Other Ambulatory Visit: Payer: Self-pay

## 2024-10-05 DIAGNOSIS — M25551 Pain in right hip: Secondary | ICD-10-CM | POA: Diagnosis not present

## 2024-10-05 DIAGNOSIS — R2689 Other abnormalities of gait and mobility: Secondary | ICD-10-CM | POA: Diagnosis present

## 2024-10-05 DIAGNOSIS — M6281 Muscle weakness (generalized): Secondary | ICD-10-CM | POA: Diagnosis present

## 2024-10-05 DIAGNOSIS — M79604 Pain in right leg: Secondary | ICD-10-CM | POA: Diagnosis present

## 2024-10-10 ENCOUNTER — Ambulatory Visit

## 2024-10-10 ENCOUNTER — Other Ambulatory Visit: Payer: Self-pay

## 2024-10-11 ENCOUNTER — Other Ambulatory Visit: Payer: Self-pay

## 2024-10-17 ENCOUNTER — Ambulatory Visit

## 2024-10-24 ENCOUNTER — Ambulatory Visit

## 2024-10-31 ENCOUNTER — Ambulatory Visit

## 2024-11-02 ENCOUNTER — Ambulatory Visit: Admitting: Surgical
